# Patient Record
Sex: Female | Born: 1975 | Race: Black or African American | Hispanic: No | Smoking: Never smoker
Health system: Southern US, Community
[De-identification: ages and names within clinical notes are randomized; demographics above are authoritative.]

## PROBLEM LIST (undated history)

## (undated) ENCOUNTER — Emergency Department (HOSPITAL_BASED_OUTPATIENT_CLINIC_OR_DEPARTMENT_OTHER): Discharge status: Absent without leave | Payer: BC Managed Care – PPO

---

## 2009-11-09 ENCOUNTER — Ambulatory Visit: Payer: Self-pay | Admitting: Diagnostic Radiology

## 2009-11-09 ENCOUNTER — Emergency Department (HOSPITAL_BASED_OUTPATIENT_CLINIC_OR_DEPARTMENT_OTHER): Admission: EM | Admit: 2009-11-09 | Discharge: 2009-11-09 | Payer: Self-pay | Admitting: Emergency Medicine

## 2010-01-19 ENCOUNTER — Emergency Department (HOSPITAL_BASED_OUTPATIENT_CLINIC_OR_DEPARTMENT_OTHER): Admission: EM | Admit: 2010-01-19 | Discharge: 2010-01-19 | Payer: Self-pay | Admitting: Emergency Medicine

## 2011-01-07 LAB — BASIC METABOLIC PANEL
BUN: 11 mg/dL (ref 6–23)
Calcium: 9 mg/dL (ref 8.4–10.5)
GFR calc Af Amer: >60 mL/min (ref >60)
Glucose, Bld: 74 mg/dL (ref 70–99)
Potassium: 4.2 mEq/L (ref 3.5–5.1)
Sodium: 142 mEq/L (ref 135–145)

## 2011-09-18 ENCOUNTER — Emergency Department (HOSPITAL_BASED_OUTPATIENT_CLINIC_OR_DEPARTMENT_OTHER)
Admission: EM | Admit: 2011-09-18 | Discharge: 2011-09-18 | Disposition: A | Discharge status: Home / Self Care | Payer: Worker's Compensation | Attending: Emergency Medicine | Admitting: Emergency Medicine

## 2011-09-18 ENCOUNTER — Encounter: Payer: Self-pay | Admitting: Emergency Medicine

## 2011-09-18 ENCOUNTER — Emergency Department (INDEPENDENT_AMBULATORY_CARE_PROVIDER_SITE_OTHER): Payer: Worker's Compensation

## 2011-09-18 DIAGNOSIS — R0789 Other chest pain: Secondary | ICD-10-CM

## 2011-09-18 DIAGNOSIS — S29011A Strain of muscle and tendon of front wall of thorax, initial encounter: Secondary | ICD-10-CM

## 2011-09-18 DIAGNOSIS — R079 Chest pain, unspecified: Secondary | ICD-10-CM | POA: Insufficient documentation

## 2011-09-18 DIAGNOSIS — IMO0002 Reserved for concepts with insufficient information to code with codable children: Secondary | ICD-10-CM | POA: Insufficient documentation

## 2011-09-18 DIAGNOSIS — X58XXXA Exposure to other specified factors, initial encounter: Secondary | ICD-10-CM | POA: Insufficient documentation

## 2011-09-18 MED ORDER — HYDROCODONE-ACETAMINOPHEN 5-325 MG PO TABS: 2.0000 | ORAL_TABLET | ORAL | Status: AC | PRN

## 2011-09-18 MED ORDER — IBUPROFEN 800 MG PO TABS: 800.0000 mg | ORAL_TABLET | Freq: Three times a day (TID) | ORAL | Status: AC

## 2011-09-18 NOTE — ED Provider Notes (Signed)
Medical screening examination/treatment/procedure(s) were performed by non-physician practitioner and as supervising physician I was immediately available for consultation/collaboration.   Forbes Cellar, MD 09/18/11 1327

## 2011-09-18 NOTE — ED Notes (Signed)
Pt c/o LT chest pain, sharp, since yesterday after lifting a heavy box at work.

## 2011-09-18 NOTE — ED Provider Notes (Signed)
History     CSN: [id number] Arrival date & time: 09/18/2011 10:31 AM   First MD Initiated Contact with Patient 09/18/11 1202      Chief Complaint  Patient presents with  . Chest Pain    (Consider location/radiation/quality/duration/timing/severity/associated sxs/prior treatment) Patient is a 35 y.o. female presenting with chest pain. The history is provided by the patient. No language interpreter was used.  Chest Pain The chest pain began yesterday. Chest pain occurs constantly. The chest pain is unchanged. The pain is associated with breathing, lifting and exertion. At its most intense, the pain is at 9/10. The pain is currently at 9/10. The severity of the pain is moderate. The quality of the pain is described as aching, sharp and stabbing. The pain does not radiate. Chest pain is worsened by certain positions. Pertinent negatives for primary symptoms include no fever and no cough. She tried nothing for the symptoms. Risk factors include no known risk factors.  Her past medical history is significant for recent injury.  Pertinent negatives for past medical history include no PE.  Pertinent negatives for family medical history include: no hypertension in family.   Pt reports she began having pain yesterday after lifting heavy boxes.  History reviewed. No pertinent past medical history.  Past Surgical History  Procedure Date  . Cesarean section     No family history on file.  History  Substance Use Topics  . Smoking status: Never Smoker   . Smokeless tobacco: Not on file  . Alcohol Use: Yes     social    OB History    Grav Para Term Preterm Abortions TAB SAB Ect Mult Living                  Review of Systems  Constitutional: Negative for fever.  Respiratory: Negative for cough.   Cardiovascular: Positive for chest pain.  All other systems reviewed and are negative.    Allergies  Review of patient's allergies indicates no known allergies.  Home Medications    No current outpatient prescriptions on file.  BP 127/81  Pulse 58  Temp(Src) 98 F (36.7 C) (Oral)  Resp 24  SpO2 100%  Physical Exam  Nursing note and vitals reviewed. Constitutional: She is oriented to person, place, and time. She appears well-developed and well-nourished.  HENT:  Head: Normocephalic and atraumatic.  Right Ear: External ear normal.  Left Ear: External ear normal.  Nose: Nose normal.  Mouth/Throat: Oropharynx is clear and moist.  Eyes: Conjunctivae and EOM are normal. Pupils are equal, round, and reactive to light.  Neck: Normal range of motion. Neck supple.  Cardiovascular: Normal rate, regular rhythm and normal heart sounds.   Pulmonary/Chest: She exhibits tenderness.  Abdominal: Soft.  Musculoskeletal: Normal range of motion.  Neurological: She is alert and oriented to person, place, and time. She has normal reflexes.  Skin: Skin is warm.  Psychiatric: She has a normal mood and affect.    ED Course  Procedures (including critical care time)  Labs Reviewed - No data to display Dg Chest 2 View  09/18/2011  *RADIOLOGY REPORT*  Clinical Data: Left chest pain.  CHEST - 2 VIEW  Comparison: 11/09/2009.  Findings: Trachea is midline.  Heart size normal.  Lungs are clear. No pleural fluid.  IMPRESSION: No acute findings.  Original Report Authenticated By: Reyes Ivan, M.D.     No diagnosis found.    MDM          Langston Masker, PA  09/18/11 1320 

## 2011-12-17 ENCOUNTER — Encounter (HOSPITAL_BASED_OUTPATIENT_CLINIC_OR_DEPARTMENT_OTHER): Payer: Self-pay | Admitting: Emergency Medicine

## 2011-12-17 ENCOUNTER — Emergency Department (HOSPITAL_BASED_OUTPATIENT_CLINIC_OR_DEPARTMENT_OTHER)
Admission: EM | Admit: 2011-12-17 | Discharge: 2011-12-17 | Disposition: A | Discharge status: Home / Self Care | Payer: Worker's Compensation | Attending: Emergency Medicine | Admitting: Emergency Medicine

## 2011-12-17 ENCOUNTER — Emergency Department (INDEPENDENT_AMBULATORY_CARE_PROVIDER_SITE_OTHER): Payer: Worker's Compensation

## 2011-12-17 DIAGNOSIS — Y9289 Other specified places as the place of occurrence of the external cause: Secondary | ICD-10-CM | POA: Insufficient documentation

## 2011-12-17 DIAGNOSIS — M79609 Pain in unspecified limb: Secondary | ICD-10-CM

## 2011-12-17 DIAGNOSIS — W208XXA Other cause of strike by thrown, projected or falling object, initial encounter: Secondary | ICD-10-CM | POA: Insufficient documentation

## 2011-12-17 DIAGNOSIS — S60229A Contusion of unspecified hand, initial encounter: Secondary | ICD-10-CM

## 2011-12-17 NOTE — Discharge Instructions (Signed)
Contusion (Bruise) of Hand An injury to the hand may cause bruises (contusions). Contusions are caused by bleeding from small blood vessels (capillaries) that allow blood to leak out into the muscles, tendons, and surrounding soft tissue. This is followed by swelling and pain (inflammation). Contusions of the hand are common because of the use of hands in daily and recreational activities. Signs of a hand injury include pain, swelling, and a color change. Initially the skin may turn blue to purple in color. As the bruise ages, the color turns yellow and orange. Swelling may decrease the movement of the fingers. Contusions are seen more commonly with:  Contact sports (especially in football, wrestling, and basketball).   Use of medications that thin the blood (anticoagulants).   Use of aspirin and nonsteroidal anti-inflammatory agents that decrease the ability of the blood to clot.   Vitamin deficiencies.   Aging.  DIAGNOSIS  Diagnosis of hand injuries can be made by your own observation. If problems continue, a caregiver may be required for further evaluation and treatment. X-rays may be required to make sure there are no broken bones (fractures). Continued problems may require physical therapy for treatment. RISKS AND COMPLICATIONS  Extensive bleeding and tissue inflammation. This can lead to disability and arthritis-type problems later on if the hand does not heal properly.   Infection of the hand if there are breaks in the skin. This is especially true if the hand injury came from someone's teeth, such as would occur with punching someone in the mouth. This can lead to an infection of the tendons and the membranes surrounding the tendons (sheaths). This infection can have severe complications including a loss of function (a "frozen" hand).   Rupture of the tendons requiring a surgical repair. Failure to repair the tendons can result in loss of function of the hand or fingers.  HOME CARE  INSTRUCTIONS   Apply ice to the injury for 15 to 20 minutes, 3 to 4 times per day. Put the ice in a plastic bag and place a towel between the bag of ice and your skin.   An elastic bandage may be used initially for support and to minimize swelling. Do not wrap the hand too tightly. Do not sleep with the elastic bandage on.   Gentle massage from the fingertips towards the elbow will help keep the swelling down. Gently open and close your fist while doing this to maintain range of motion. Do this only after the first few days, when there is no or minimal pain.   Keep your hand above the level of the heart when swelling and pain are present. This will allow the fluid to drain out of the hand, decreasing the amount of swelling. This will improve healing time.   Try to avoid use of the injured hand (except for gentle range of motion) while the hand is hurting. Do not resume use until instructed by your caregiver. Then begin use gradually, do not increase use to the point of pain. If pain does develop, decrease use and continue the above measures, gradually increasing activities that do not cause discomfort until you achieve normal use.   Only take over-the-counter or prescription medicines for pain, discomfort, or fever as directed by your caregiver.   Follow up with your caregiver as directed. Follow-up care may include orthopedic referrals, physical therapy, and rehabilitation. Any delay in obtaining necessary care could result in delayed healing, or temporary or permanent disability.  REHABILITATION  Begin daily rehabilitation exercises when   an elastic bandage is no longer needed and you are either pain free or only have minimal pain.   Use ice massage for 10 minutes before and after workouts. Put ice in a plastic bag and place a towel between the bag of ice and your skin. Massage the injured area with the ice pack.  SEEK IMMEDIATE MEDICAL CARE IF:   Your pain and swelling increase, or pain is  uncontrolled with medications.   You have loss of feeling in your hand, or your hand turns cold or blue.   An oral temperature above 102 F (38.9 C) develops, not controlled by medication.   Your hand becomes warm to the touch, or you have increased pain with even slight movement of your fingers.   Your hand does not begin to improve in 1 or 2 days.   The skin is broken and signs of infection occur (fluid draining from the contusion, increasing pain, fever, headache, muscle aches, dizziness, or a general ill feeling).   You develop new, unexplained problems, or an increase of the symptoms that brought you to your caregiver.  MAKE SURE YOU:   Understand these instructions.   Will watch your condition.   Will get help right away if you are not doing well or get worse.  Document Released: 03/29/2002 Document Revised: 06/19/2011 Document Reviewed: 03/16/2010 [employer] Patient Information 2012 [employer]. 

## 2011-12-17 NOTE — ED Notes (Signed)
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

## 2011-12-17 NOTE — ED Provider Notes (Signed)
History     CSN: [id number]  Arrival date & time 12/17/11  1308   First MD Initiated Contact with Patient 12/17/11 [phone]      Chief Complaint  Patient presents with  . Hand Pain    (Consider location/radiation/quality/duration/timing/severity/associated sxs/prior treatment) HPI Comments: Box/basket dropped on hand yesterday at work.  Patient is a 36 y.o. female presenting with hand pain. The history is provided by the patient.  Hand Pain This is a new problem. The current episode started yesterday. The problem occurs constantly. The problem has not changed since onset.Exacerbated by: movement, palpation. The symptoms are relieved by nothing. She has tried nothing for the symptoms.    History reviewed. No pertinent past medical history.  Past Surgical History  Procedure Date  . Cesarean section     No family history on file.  History  Substance Use Topics  . Smoking status: Never Smoker   . Smokeless tobacco: Not on file  . Alcohol Use: No     social    OB History    Grav Para Term Preterm Abortions TAB SAB Ect Mult Living                  Review of Systems  All other systems reviewed and are negative.    Allergies  Review of patient's allergies indicates no known allergies.  Home Medications  No current outpatient prescriptions on file.  BP 144/94  Pulse 81  Temp(Src) 98.6 F (37 C) (Oral)  Resp 16  SpO2 100%  Physical Exam  Nursing note and vitals reviewed. Constitutional: She is oriented to person, place, and time. She appears well-developed and well-nourished.  HENT:  Head: Normocephalic and atraumatic.  Neck: Normal range of motion. Neck supple.  Pulmonary/Chest: Effort normal.  Musculoskeletal:       There is ttp over the dorsum of the hand with minimal swelling.  There is full range of motion with moderate discomfort.  Sensation intact distally.  Neurological: She is alert and oriented to person, place, and time.  Skin: Skin is warm and  dry.    ED Course  Procedures (including critical care time)  Labs Reviewed - No data to display No results found.   No diagnosis found.    MDM          Geoffery Lyons, MD 12/17/11 [phone]

## 2013-02-20 ENCOUNTER — Encounter (HOSPITAL_BASED_OUTPATIENT_CLINIC_OR_DEPARTMENT_OTHER): Payer: Self-pay | Admitting: *Deleted

## 2013-02-20 ENCOUNTER — Emergency Department (HOSPITAL_BASED_OUTPATIENT_CLINIC_OR_DEPARTMENT_OTHER)
Admission: EM | Admit: 2013-02-20 | Discharge: 2013-02-20 | Disposition: A | Discharge status: Home / Self Care | Payer: BC Managed Care – PPO | Attending: Emergency Medicine | Admitting: Emergency Medicine

## 2013-02-20 DIAGNOSIS — H579 Unspecified disorder of eye and adnexa: Secondary | ICD-10-CM | POA: Insufficient documentation

## 2013-02-20 DIAGNOSIS — K2970 Gastritis, unspecified, without bleeding: Secondary | ICD-10-CM | POA: Insufficient documentation

## 2013-02-20 DIAGNOSIS — R109 Unspecified abdominal pain: Secondary | ICD-10-CM | POA: Insufficient documentation

## 2013-02-20 DIAGNOSIS — Z3202 Encounter for pregnancy test, result negative: Secondary | ICD-10-CM | POA: Insufficient documentation

## 2013-02-20 DIAGNOSIS — H109 Unspecified conjunctivitis: Secondary | ICD-10-CM | POA: Insufficient documentation

## 2013-02-20 DIAGNOSIS — R110 Nausea: Secondary | ICD-10-CM | POA: Insufficient documentation

## 2013-02-20 LAB — COMPREHENSIVE METABOLIC PANEL
Alkaline Phosphatase: 60 U/L (ref 39–117)
CO2: 27 mEq/L (ref 19–32)
Chloride: 104 mEq/L (ref 96–112)
GFR calc Af Amer: >90 mL/min (ref >90)
Glucose, Bld: 97 mg/dL (ref 70–99)
Sodium: 139 mEq/L (ref 135–145)
Total Bilirubin: 0.4 mg/dL (ref 0.3–1.2)

## 2013-02-20 LAB — CBC WITH DIFFERENTIAL/PLATELET
Basophils Absolute: 0 10*3/uL (ref 0.0–0.1)
Basophils Relative: 0 % (ref 0–1)
Lymphocytes Relative: 36 % (ref 12–46)
MCH: 30.4 pg (ref 26.0–34.0)
MCHC: 34.3 g/dL (ref 30.0–36.0)
MCV: 88.6 fL (ref 78.0–100.0)
Monocytes Absolute: 0.4 10*3/uL (ref 0.1–1.0)
Monocytes Relative: 5 % (ref 3–12)
Neutro Abs: 4.7 10*3/uL (ref 1.7–7.7)
Neutrophils Relative %: 56 % (ref 43–77)
RBC: 4.31 MIL/uL (ref 3.87–5.11)
WBC: 8.4 10*3/uL (ref 4.0–10.5)

## 2013-02-20 LAB — URINALYSIS, ROUTINE W REFLEX MICROSCOPIC
Ketones, ur: NEGATIVE mg/dL
Leukocytes, UA: NEGATIVE
Specific Gravity, Urine: 1.012 (ref 1.005–1.030)
pH: 7 (ref 5.0–8.0)

## 2013-02-20 LAB — URINE MICROSCOPIC-ADD ON

## 2013-02-20 LAB — LIPASE, BLOOD: Lipase: 25 U/L (ref 11–59)

## 2013-02-20 LAB — PREGNANCY, URINE: Preg Test, Ur: NEGATIVE

## 2013-02-20 MED ORDER — PANTOPRAZOLE SODIUM 40 MG PO TBEC: 40.0000 mg | DELAYED_RELEASE_TABLET | Freq: Every day | ORAL | Status: DC

## 2013-02-20 MED ORDER — SULFACETAMIDE SODIUM 10 % OP SOLN
1.0000 [drp] | OPHTHALMIC | Status: DC
  Administered 2013-02-20: 1 [drp] via OPHTHALMIC
  Filled 2013-02-20: qty 15

## 2013-02-20 NOTE — ED Notes (Signed)
Gave instruction for eye drops:  1 gtt in each eye every 2 hours while awake for 5 days

## 2013-02-20 NOTE — ED Notes (Addendum)
Pt states that eyes have been red and itching since yesterday, Green drainage, Congested. "[place] going around school" Also c/o abd pain and nausea. Denies vag d/c or urinary s/s.

## 2013-02-20 NOTE — ED Provider Notes (Signed)
History     CSN: [id number]  Arrival date & time 02/20/13  1106   First MD Initiated Contact with Patient 02/20/13 1126      Chief Complaint  Patient presents with  . Eye Problem  . Abdominal Pain    (Consider location/radiation/quality/duration/timing/severity/associated sxs/prior treatment) Patient is a 37 y.o. female presenting with eye problem and abdominal pain.  Eye Problem Abdominal Pain  Pt reports 2 days of worsening redness, itching a drainage to both eyes. Woke up with crusting this AM. 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 Denies any fever or blurry vision, does not wear contact, mild nasal congestion.   She has a secondary complaint of intermittent epigastric pain for the last month, described as gnawing, improved after eating, occasionally associated with nausea. No lower abdominal pain no dysuria, hematuria, vaginal bleeding or discharge. She has not had pain today.   History reviewed. No pertinent past medical history.  Past Surgical History  Procedure Laterality Date  . Cesarean section      History reviewed. No pertinent family history.  History  Substance Use Topics  . Smoking status: Never Smoker   . Smokeless tobacco: Not on file  . Alcohol Use: No     Comment: social    OB History   Grav Para Term Preterm Abortions TAB SAB Ect Mult Living                  Review of Systems  Gastrointestinal: Positive for abdominal pain.   All other systems reviewed and are negative except as noted in HPI.   Allergies  Review of patient's allergies indicates no known allergies.  Home Medications  No current outpatient prescriptions on file.  BP 127/70  Pulse 58  Temp(Src) 98.1 F (36.7 C) (Oral)  Resp 18  Ht 5\' 2"  (1.575 m)  Wt 227 lb (102.967 kg)  BMI 41.51 kg/m2  SpO2 100%  LMP 01/19/2013  Physical Exam  Nursing note and vitals reviewed. Constitutional: She is oriented to person, place, and time. She  appears well-developed and well-nourished.  HENT:  Head: Normocephalic and atraumatic.  Eyes: EOM are normal. Pupils are equal, round, and reactive to light. Right eye exhibits discharge. Left eye exhibits discharge. Right conjunctiva is injected. Left conjunctiva is injected.  Neck: Normal range of motion. Neck supple.  Cardiovascular: Normal rate, normal heart sounds and intact distal pulses.   Pulmonary/Chest: Effort normal and breath sounds normal.  Abdominal: Bowel sounds are normal. She exhibits no distension. There is no tenderness.  Musculoskeletal: Normal range of motion. She exhibits no edema and no tenderness.  Neurological: She is alert and oriented to person, place, and time. She has normal strength. No cranial nerve deficit or sensory deficit.  Skin: Skin is warm and dry. No rash noted.  Psychiatric: She has a normal mood and affect.    ED Course  Procedures (including critical care time)  Labs Reviewed  URINALYSIS, ROUTINE W REFLEX MICROSCOPIC - Abnormal; Notable for the following:    Hgb urine dipstick LARGE (*)    All other components within normal limits  URINE MICROSCOPIC-ADD ON - Abnormal; Notable for the following:    Squamous Epithelial / LPF FEW (*)    Bacteria, UA FEW (*)    All other components within normal limits  PREGNANCY, URINE  CBC WITH DIFFERENTIAL  COMPREHENSIVE METABOLIC PANEL  LIPASE, BLOOD   No results found.   1. Conjunctivitis   2. Gastritis  MDM  Labs unremarkable. Abdomen benign. Will treat with PPI for possible gastritis. Abx eye drops for conjunctivitis, PCP followup.        Charles B. Bernette Mayers, MD 02/20/13 [phone]

## 2014-02-08 ENCOUNTER — Encounter (HOSPITAL_BASED_OUTPATIENT_CLINIC_OR_DEPARTMENT_OTHER): Payer: Self-pay | Admitting: Emergency Medicine

## 2014-02-08 ENCOUNTER — Emergency Department (HOSPITAL_BASED_OUTPATIENT_CLINIC_OR_DEPARTMENT_OTHER)
Admission: EM | Admit: 2014-02-08 | Discharge: 2014-02-08 | Disposition: A | Discharge status: Home / Self Care | Payer: BC Managed Care – PPO | Attending: Emergency Medicine | Admitting: Emergency Medicine

## 2014-02-08 DIAGNOSIS — Z79899 Other long term (current) drug therapy: Secondary | ICD-10-CM | POA: Insufficient documentation

## 2014-02-08 DIAGNOSIS — I8390 Asymptomatic varicose veins of unspecified lower extremity: Secondary | ICD-10-CM | POA: Insufficient documentation

## 2014-02-08 NOTE — ED Notes (Signed)
MD at bedside. 

## 2014-02-08 NOTE — ED Provider Notes (Signed)
CSN: [id number]     Arrival date & time 02/08/14  2005 History  This chart was scribed for Hilario Quarryanielle S Tarryn Bogdan, MD by Ardelia Memsylan Malpass, ED Scribe. This patient was seen in room MH12/MH12 and the patient's care was started at 9:08 PM    Chief Complaint  Patient presents with  . Leg Pain     Patient is a 38 y.o. female presenting with leg pain. The history is provided by the patient. No language interpreter was used.  Leg Pain Lower extremity pain location: left calf. Time since incident:  1 day Injury: no   Pain details:    Quality:  Shooting   Radiates to:  L leg   Timing:  Constant   Progression:  Worsening Chronicity:  New Foreign body present:  No foreign bodies Prior injury to area:  No Relieved by:  None tried Worsened by:  Nothing tried Ineffective treatments:  None tried   HPI Comments: Elizabeth Stampsrina Rios is a 38 y.o. female who presents to the Emergency Department complaining of constant "shooting" moderate left calf pain that radiates throughout her entire leg onset today. Patient states that she has had a varicose vein to that area for years but notes it has swollen up today and caused the pain. Patient denies any injury to the area. Patient denies any chronic health problems or history of DVT.   History reviewed. No pertinent past medical history. Past Surgical History  Procedure Laterality Date  . Cesarean section     No family history on file. History  Substance Use Topics  . Smoking status: Never Smoker   . Smokeless tobacco: Not on file  . Alcohol Use: Yes     Comment: social   OB History   Grav Para Term Preterm Abortions TAB SAB Ect Mult Living                 Review of Systems  Musculoskeletal:       Left calf pain  All other systems reviewed and are negative.   Allergies  Review of patient's allergies indicates no known allergies.  Home Medications   Prior to Admission medications   Medication Sig Start Date End Date Taking? Authorizing Provider   pantoprazole (PROTONIX) 40 MG tablet Take 1 tablet (40 mg total) by mouth daily. 02/20/13   Charles B. Bernette MayersSheldon, MD   Molli Knockraige Vitals: BP 152/108  Pulse 90  Temp(Src) 98.4 F (36.9 C) (Oral)  Resp 22  Wt 245 lb (111.131 kg)  SpO2 100%  LMP 01/08/2014  Physical Exam  Nursing note and vitals reviewed. Constitutional: She is oriented to person, place, and time. She appears well-developed and well-nourished. No distress.  HENT:  Head: Normocephalic and atraumatic.  Eyes: EOM are normal.  Neck: Neck supple. No tracheal deviation present.  Cardiovascular: Normal rate.   Pulmonary/Chest: Effort normal. No respiratory distress.  Musculoskeletal: Normal range of motion.  varicosity to the left upper leg, posterior aspect Tender to palpation, quarter in size  Neurological: She is alert and oriented to person, place, and time.  Skin: Skin is warm and dry.  Psychiatric: She has a normal mood and affect. Her behavior is normal.    ED Course  Procedures (including critical care time)  DIAGNOSTIC STUDIES: Oxygen Saturation is 100% on RA, normal by my interpretation.    COORDINATION OF CARE: 9:11 PM- Advised pt to apply warm compresses. Pt advised of plan for discharge and pt agrees with plan.  Labs Review Labs Reviewed - No data  to display  Imaging Review No results found.   EKG Interpretation None      MDM   Final diagnoses:  Varicose vein of leg    I personally performed the services described in this documentation, which was scribed in my presence. The recorded information has been reviewed and considered.   Hilario Quarryanielle S Valeen Borys, MD 02/09/14 [phone]

## 2014-02-08 NOTE — ED Notes (Signed)
"  vericose vein" on the back of left calf that is causing pain today.

## 2014-02-08 NOTE — Discharge Instructions (Signed)
Phlebitis Phlebitis is soreness and swelling (inflammation) of a vein. This can occur in your arms, legs, or torso (trunk), as well as deeper inside your body. Phlebitis is usually not serious when it occurs close to the surface of the body. However, it can cause serious problems when it occurs in a vein deeper inside the body. CAUSES  Phlebitis can be triggered by various things, including:   Reduced blood flow through your veins. This can happen with:  Bed rest over a long period.  Long-distance travel.  Injury.  Surgery.  Being overweight (obese) or pregnant.  Having an IV tube put in the vein and getting certain medicines through the vein.  Cancer and cancer treatment.  Use of illegal drugs taken through the vein.  Inflammatory diseases.  Inherited (genetic) diseases that increase the risk of blood clots.  Hormone therapy, such as birth control pills. SIGNS AND SYMPTOMS   Red, tender, swollen, and painful area on your skin. Usually, the area will be long and narrow.  Firmness along the center of the affected area. This can indicate that a blood clot has formed.  Low-grade fever. DIAGNOSIS  A health care provider can usually diagnose phlebitis by examining the affected area and asking about your symptoms. To check for infection or blood clots, your health care provider may order blood tests or an ultrasound exam of the area. Blood tests and your family history may also indicate if you have an underlying genetic disease that causes blood clots. Occasionally, a piece of tissue is taken from the body (biopsy sample) if an unusual cause of phlebitis is suspected. TREATMENT  Treatment will vary depending on the severity of the condition and the area of the body affected. Treatment may include:  Use of a warm compress or heating pad.  Use of compression stockings or bandages.  Anti-inflammatory medicines.  Removal of any IV tube that may be causing the problem.  Medicines  that kill germs (antibiotics) if an infection is present.  Blood-thinning medicines if a blood clot is suspected or present.  In rare cases, surgery may be needed to remove damaged sections of vein. HOME CARE INSTRUCTIONS   Only take over-the-counter or prescription medicines as directed by your health care provider. Take all medicines exactly as prescribed.  Raise (elevate) the affected area above the level of your heart as directed by your health care provider.  Apply a warm compress or heating pad to the affected area as directed by your health care provider. Do not sleep with the heating pad.  Use compression stockings or bandages as directed. These will speed healing and prevent the condition from coming back.  If you are on blood thinners:  Get follow-up blood tests as directed by your health care provider.  Check with your health care provider before using any new medicines.  Carry a medical alert card or wear your medical alert jewelry to show that you are on blood thinners.  For phlebitis in the legs:  Avoid prolonged standing or bed rest.  Keep your legs moving. Raise your legs when sitting or lying.  Do not smoke.  Women, particularly those over the age of 59, should consider the risks and benefits of taking the contraceptive pill. This kind of hormone treatment can increase your risk for blood clots.  Follow up with your health care provider as directed. SEEK MEDICAL CARE IF:   You have unusual bruising or any bleeding problems.  Your swelling or pain in the affected area  is not improving.  You are on anti-inflammatory medicine, and you develop belly (abdominal) pain. SEEK IMMEDIATE MEDICAL CARE IF:   You have a sudden onset of chest pain or difficulty breathing.  You have a fever or persistent symptoms for more than 2 3 days.  You have a fever and your symptoms suddenly get worse. MAKE SURE YOU:  Understand these instructions.  Will watch your  condition.  Will get help right away if you are not doing well or get worse. Document Released: 10/01/2001 Document Revised: 07/28/2013 Document Reviewed: 06/14/2013 [facility] Patient Information 2014 [employer], [place]. Varicose Veins Varicose veins are veins that have become enlarged and twisted. CAUSES This condition is the result of valves in the veins not working properly. Valves in the veins help return blood from the leg to the heart. If these valves are damaged, blood flows backwards and backs up into the veins in the leg near the skin. This causes the veins to become larger. People who are on their feet a lot, who are pregnant, or who are overweight are more likely to develop varicose veins. SYMPTOMS   Bulging, twisted-appearing, bluish veins, most commonly found on the legs.  Leg pain or a feeling of heaviness. These symptoms may be worse at the end of the day.  Leg swelling.  Skin color changes. DIAGNOSIS  Varicose veins can usually be diagnosed with an exam of your legs by your caregiver. He or she may recommend an ultrasound of your leg veins. TREATMENT  Most varicose veins can be treated at home.However, other treatments are available for people who have persistent symptoms or who want to treat the cosmetic appearance of the varicose veins. These include:  Laser treatment of very small varicose veins.  Medicine that is shot (injected) into the vein. This medicine hardens the walls of the vein and closes off the vein. This treatment is called sclerotherapy. Afterwards, you may need to wear clothing or bandages that apply pressure.  Surgery. HOME CARE INSTRUCTIONS   Do not stand or sit in one position for long periods of time. Do not sit with your legs crossed. Rest with your legs raised during the day.  Wear elastic stockings or support hose. Do not wear other tight, encircling garments around the legs, pelvis, or waist.  Walk as much as possible to increase blood  flow.  Raise the foot of your bed at night with 2-inch blocks.  If you get a cut in the skin over the vein and the vein bleeds, lie down with your leg raised and press on it with a clean cloth until the bleeding stops. Then place a bandage (dressing) on the cut. See your caregiver if it continues to bleed or needs stitches. SEEK MEDICAL CARE IF:   The skin around your ankle starts to break down.  You have pain, redness, tenderness, or hard swelling developing in your leg over a vein.  You are uncomfortable due to leg pain. Document Released: 07/17/2005 Document Revised: 12/30/2011 Document Reviewed: 12/03/2010 [facility] Patient Information 2014 [employer], [place].

## 2014-08-11 ENCOUNTER — Emergency Department (HOSPITAL_BASED_OUTPATIENT_CLINIC_OR_DEPARTMENT_OTHER)
Admission: EM | Admit: 2014-08-11 | Discharge: 2014-08-11 | Disposition: A | Discharge status: Home / Self Care | Payer: BC Managed Care – PPO | Attending: Emergency Medicine | Admitting: Emergency Medicine

## 2014-08-11 ENCOUNTER — Encounter (HOSPITAL_BASED_OUTPATIENT_CLINIC_OR_DEPARTMENT_OTHER): Payer: Self-pay | Admitting: Emergency Medicine

## 2014-08-11 ENCOUNTER — Emergency Department (HOSPITAL_BASED_OUTPATIENT_CLINIC_OR_DEPARTMENT_OTHER): Payer: BC Managed Care – PPO

## 2014-08-11 DIAGNOSIS — R2242 Localized swelling, mass and lump, left lower limb: Secondary | ICD-10-CM | POA: Diagnosis not present

## 2014-08-11 DIAGNOSIS — M79672 Pain in left foot: Secondary | ICD-10-CM | POA: Insufficient documentation

## 2014-08-11 DIAGNOSIS — Z87828 Personal history of other (healed) physical injury and trauma: Secondary | ICD-10-CM | POA: Diagnosis not present

## 2014-08-11 DIAGNOSIS — Z79899 Other long term (current) drug therapy: Secondary | ICD-10-CM | POA: Insufficient documentation

## 2014-08-11 MED ORDER — IBUPROFEN 800 MG PO TABS
800.0000 mg | ORAL_TABLET | Freq: Once | ORAL | Status: AC
  Administered 2014-08-11: 800 mg via ORAL
  Filled 2014-08-11: qty 1

## 2014-08-11 MED ORDER — HYDROCODONE-ACETAMINOPHEN 5-325 MG PO TABS
2.0000 | ORAL_TABLET | Freq: Once | ORAL | Status: AC
  Administered 2014-08-11: 2 via ORAL
  Filled 2014-08-11: qty 2

## 2014-08-11 MED ORDER — IBUPROFEN 800 MG PO TABS: 800.0000 mg | ORAL_TABLET | Freq: Three times a day (TID) | ORAL | Status: DC

## 2014-08-11 MED ORDER — HYDROCODONE-ACETAMINOPHEN 5-325 MG PO TABS: 1.0000 | ORAL_TABLET | Freq: Four times a day (QID) | ORAL | Status: DC | PRN
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
## 2014-08-11 NOTE — ED Notes (Signed)
Pt reports pain/swelling to left foot x 2 days. Denies injury

## 2014-08-11 NOTE — Discharge Instructions (Signed)
Take motrin for pain.   Take vicodin for severe pain. Do NOT drive with it.   Follow up with Dr. Pearletha ForgeHudnall if you still have pain.   Use crutches as needed.   Return to ER if you have severe pain, unable to walk.

## 2016-03-20 ENCOUNTER — Emergency Department (HOSPITAL_BASED_OUTPATIENT_CLINIC_OR_DEPARTMENT_OTHER)
Admission: EM | Admit: 2016-03-20 | Discharge: 2016-03-20 | Disposition: A | Discharge status: Home / Self Care | Payer: Worker's Compensation | Attending: Emergency Medicine | Admitting: Emergency Medicine

## 2016-03-20 ENCOUNTER — Encounter (HOSPITAL_BASED_OUTPATIENT_CLINIC_OR_DEPARTMENT_OTHER): Payer: Self-pay | Admitting: *Deleted

## 2016-03-20 ENCOUNTER — Emergency Department (HOSPITAL_BASED_OUTPATIENT_CLINIC_OR_DEPARTMENT_OTHER): Payer: Worker's Compensation

## 2016-03-20 DIAGNOSIS — S8991XA Unspecified injury of right lower leg, initial encounter: Secondary | ICD-10-CM | POA: Diagnosis present

## 2016-03-20 DIAGNOSIS — S8011XA Contusion of right lower leg, initial encounter: Secondary | ICD-10-CM | POA: Insufficient documentation

## 2016-03-20 DIAGNOSIS — Y999 Unspecified external cause status: Secondary | ICD-10-CM | POA: Insufficient documentation

## 2016-03-20 DIAGNOSIS — W11XXXA Fall on and from ladder, initial encounter: Secondary | ICD-10-CM | POA: Diagnosis not present

## 2016-03-20 DIAGNOSIS — Y9339 Activity, other involving climbing, rappelling and jumping off: Secondary | ICD-10-CM | POA: Diagnosis not present

## 2016-03-20 DIAGNOSIS — Y929 Unspecified place or not applicable: Secondary | ICD-10-CM | POA: Insufficient documentation

## 2016-03-20 DIAGNOSIS — W010XXA Fall on same level from slipping, tripping and stumbling without subsequent striking against object, initial encounter: Secondary | ICD-10-CM

## 2016-03-20 MED ORDER — ACETAMINOPHEN 500 MG PO TABS
1000.0000 mg | ORAL_TABLET | Freq: Once | ORAL | Status: AC
  Administered 2016-03-20: 1000 mg via ORAL
  Filled 2016-03-20: qty 2

## 2016-03-20 NOTE — ED Provider Notes (Signed)
CSN: [id number]     Arrival date & time 03/20/16  1739 History   By signing my name below, I, [name], attest that this documentation has been prepared under the direction and in the presence of Cathren LaineKevin Ziv Welchel, MD.  Electronically Signed: Gillis EndsJasmyn B. Lyn HollingsheadAlexander, ED Scribe. 03/20/2016. 6:18 PM.    Chief Complaint  Patient presents with  . Leg Injury    The history is provided by the patient. No language interpreter was used.    HPI Comments: Elizabeth Rios is a 40 y.o. female who presents to the Emergency Department complaining of gradual onset, constant, throbbing, lower right leg pain that radiates to the right foot which occurred about 10 hours PTA. She reports that she climbed up 4 steps on the ladder while at work. While coming down from the ladder, she noticed a snake on the shelf which scared the patient causing her to miss two steps off the ladder and land on her right leg. No head injury or LOC. Pt reports that she has generalized body 'soreness' from the fall. She has not taken any medicines for pain. There are no modifying factors. She denies any chest pain, abdominal pain, shortness of breath, weakness, numbness, tingling, hip pain, knee pain, or back pain.      History reviewed. No pertinent past medical history. Past Surgical History  Procedure Laterality Date  . Cesarean section     History reviewed. No pertinent family history. Social History  Substance Use Topics  . Smoking status: Never Smoker   . Smokeless tobacco: None  . Alcohol Use: Yes     Comment: social   OB History    No data available     Review of Systems  Respiratory: Negative for shortness of breath.   Cardiovascular: Negative for chest pain.  Gastrointestinal: Negative for abdominal pain.  Musculoskeletal: Positive for myalgias and arthralgias. Negative for back pain and neck pain.  Skin: Negative for wound.  Neurological: Negative for syncope, weakness and numbness.  All other systems  reviewed and are negative.   Allergies  Review of patient's allergies indicates no known allergies.  Home Medications   Prior to Admission medications   Not on File   BP 164/93 mmHg  Pulse 80  Temp(Src) 99.1 F (37.3 C) (Oral)  Resp 18  Ht 5\' 1"  (1.549 m)  Wt 220 lb (99.791 kg)  BMI 41.59 kg/m2  SpO2 98%   Physical Exam  Constitutional: She appears well-developed and well-nourished.  HENT:  Head: Normocephalic and atraumatic.  Eyes: EOM are normal. Pupils are equal, round, and reactive to light.  Neck: Normal range of motion.  Cardiovascular: Normal rate, regular rhythm, normal heart sounds and intact distal pulses.   Pulmonary/Chest: Effort normal and breath sounds normal. No respiratory distress. She has no wheezes. She has no rales.  Abdominal: Soft. Bowel sounds are normal. She exhibits no distension. There is no tenderness. There is no rebound and no guarding.  Musculoskeletal: Normal range of motion. She exhibits tenderness. She exhibits no edema.  No significant soft tissue swelling.  compartments of lower leg soft, not tense. Knee stable. No effusion. good rom without pain. Tenderness to palpation of right lower leg. Distal pulses palp. Ankle stable, non tender.  Neurological: She is alert. No cranial nerve deficit.  RLE motor, sens intact. Steady gait.   Skin: Skin is warm and dry. No rash noted.  Psychiatric: She has a normal mood and affect. Judgment normal.  Nursing note and vitals reviewed.  ED Course  Procedures (including critical care time) DIAGNOSTIC STUDIES: Oxygen Saturation is 98% on RA, normal by my interpretation.    COORDINATION OF CARE: 6:18 PM-Discussed treatment plan which includes X-Ray of lower right leg with pt at bedside and pt agreed to plan. Will order Tylenol for pain.   Dg Tibia/fibula Right  03/20/2016  CLINICAL DATA:  Pain after fall EXAM: RIGHT TIBIA AND FIBULA - 2 VIEW COMPARISON:  None. FINDINGS: There is no evidence of fracture  or other focal bone lesions. Soft tissues are unremarkable. IMPRESSION: Negative. Electronically Signed   By: Gerome Sam III M.D   On: 03/20/2016 19:27      I have personally reviewed and evaluated these images as part of my medical decision-making.    MDM   I personally performed the services described in this documentation, which was scribed in my presence. The recorded information has been reviewed and considered. Cathren Laine, MD  Xrays.  xrays neg.  Motrin po.  Icepack.  Discussed xrays w pt.    Pt requests work note/light duty.      Cathren Laine, MD 03/20/16 [phone]

## 2016-03-20 NOTE — Discharge Instructions (Signed)
It was our pleasure to provide your ER care today - we hope that you feel better.  Cold pack to sore area.  Take motrin or aleve as need for pain.  Follow up with primary care doctor in 1 week if symptoms fail to improve/resolve.  Return to ER if worse, new symptoms, severe pain, other concern.     Contusion A contusion is a deep bruise. Contusions happen when an injury causes bleeding under the skin. Symptoms of bruising include pain, swelling, and discolored skin. The skin may turn blue, purple, or yellow. HOME CARE   Rest the injured area.  If told, put ice on the injured area.  Put ice in a plastic bag.  Place a towel between your skin and the bag.  Leave the ice on for 20 minutes, 2-3 times per day.  If told, put light pressure (compression) on the injured area using an elastic bandage. Make sure the bandage is not too tight. Remove it and put it back on as told by your doctor.  If possible, raise (elevate) the injured area above the level of your heart while you are sitting or lying down.  Take over-the-counter and prescription medicines only as told by your doctor. GET HELP IF:  Your symptoms do not get better after several days of treatment.  Your symptoms get worse.  You have trouble moving the injured area. GET HELP RIGHT AWAY IF:   You have very bad pain.  You have a loss of feeling (numbness) in a hand or foot.  Your hand or foot turns pale or cold.   This information is not intended to replace advice given to you by your health care provider. Make sure you discuss any questions you have with your health care provider.   Document Released: 03/25/2008 Document Revised: 06/28/2015 Document Reviewed: 02/22/2015 Elsevier Interactive Patient Education 2016 Elsevier Inc.     Cryotherapy Cryotherapy is when you put ice on your injury. Ice helps lessen pain and puffiness (swelling) after an injury. Ice works the best when you start using it in the first 24  to 48 hours after an injury. HOME CARE  Put a dry or damp towel between the ice pack and your skin.  You may press gently on the ice pack.  Leave the ice on for no more than 10 to 20 minutes at a time.  Check your skin after 5 minutes to make sure your skin is okay.  Rest at least 20 minutes between ice pack uses.  Stop using ice when your skin loses feeling (numbness).  Do not use ice on someone who cannot tell you when it hurts. This includes small children and people with memory problems (dementia). GET HELP RIGHT AWAY IF:  You have white spots on your skin.  Your skin turns blue or pale.  Your skin feels waxy or hard.  Your puffiness gets worse. MAKE SURE YOU:   Understand these instructions.  Will watch your condition.  Will get help right away if you are not doing well or get worse.   This information is not intended to replace advice given to you by your health care provider. Make sure you discuss any questions you have with your health care provider.   Document Released: 03/25/2008 Document Revised: 12/30/2011 Document Reviewed: 05/30/2011 Elsevier Interactive Patient Education [employer] Elsevier Inc.

## 2016-03-20 NOTE — ED Notes (Signed)
Patient transported to X-ray 

## 2016-03-20 NOTE — ED Notes (Signed)
Pt c/o fall this am with right leg foot and ankle pain

## 2016-03-20 NOTE — ED Notes (Signed)
MD at bedside. 

## 2016-05-06 DIAGNOSIS — R600 Localized edema: Secondary | ICD-10-CM | POA: Insufficient documentation

## 2016-05-06 DIAGNOSIS — I8390 Asymptomatic varicose veins of unspecified lower extremity: Secondary | ICD-10-CM | POA: Insufficient documentation

## 2016-11-05 ENCOUNTER — Emergency Department (HOSPITAL_BASED_OUTPATIENT_CLINIC_OR_DEPARTMENT_OTHER): Payer: BC Managed Care – PPO

## 2016-11-05 ENCOUNTER — Encounter (HOSPITAL_BASED_OUTPATIENT_CLINIC_OR_DEPARTMENT_OTHER): Payer: Self-pay | Admitting: *Deleted

## 2016-11-05 ENCOUNTER — Emergency Department (HOSPITAL_BASED_OUTPATIENT_CLINIC_OR_DEPARTMENT_OTHER)
Admission: EM | Admit: 2016-11-05 | Discharge: 2016-11-06 | Disposition: A | Discharge status: Home / Self Care | Payer: BC Managed Care – PPO | Attending: Emergency Medicine | Admitting: Emergency Medicine

## 2016-11-05 DIAGNOSIS — R0789 Other chest pain: Secondary | ICD-10-CM | POA: Insufficient documentation

## 2016-11-05 DIAGNOSIS — R072 Precordial pain: Secondary | ICD-10-CM | POA: Diagnosis present

## 2016-11-05 LAB — BASIC METABOLIC PANEL
ANION GAP: 7 (ref 5–15)
BUN: 11 mg/dL (ref 6–20)
CO2: 26 mmol/L (ref 22–32)
Calcium: 8.9 mg/dL (ref 8.9–10.3)
Chloride: 102 mmol/L (ref 101–111)
Creatinine, Ser: 0.7 mg/dL (ref 0.44–1.00)
GFR calc Af Amer: >60 mL/min (ref >60)
GFR calc non Af Amer: >60 mL/min (ref >60)
GLUCOSE: 91 mg/dL (ref 65–99)
POTASSIUM: 3.7 mmol/L (ref 3.5–5.1)
Sodium: 135 mmol/L (ref 135–145)

## 2016-11-05 LAB — CBC
HCT: 37.5 % (ref 36.0–46.0)
HEMOGLOBIN: 12.8 g/dL (ref 12.0–15.0)
MCH: 30 pg (ref 26.0–34.0)
MCHC: 34.1 g/dL (ref 30.0–36.0)
MCV: 87.8 fL (ref 78.0–100.0)
Platelets: 267 10*3/uL (ref 150–400)
RBC: 4.27 MIL/uL (ref 3.87–5.11)
RDW: 11.9 % (ref 11.5–15.5)
WBC: 11.4 10*3/uL — ABNORMAL HIGH (ref 4.0–10.5)

## 2016-11-05 LAB — TROPONIN I: Troponin I: <0.03 ng/mL (ref <0.03)

## 2016-11-05 NOTE — ED Triage Notes (Signed)
Sharp pain in her left upper chest x 45 minutes.

## 2016-11-06 DIAGNOSIS — R0789 Other chest pain: Secondary | ICD-10-CM | POA: Diagnosis not present

## 2016-11-06 LAB — TROPONIN I: Troponin I: <0.03 ng/mL (ref <0.03)

## 2016-11-06 NOTE — ED Notes (Signed)
ED Provider at bedside. 

## 2016-11-06 NOTE — ED Provider Notes (Signed)
[facility] Provider Note: Lowella DellJ. Lane Elsey Holts, MD, FACEP  CSN: [id number] MRN: [id number] ARRIVAL: 11/05/16 at 2221 ROOM: [facility]   CHIEF COMPLAINT  Chest Pain   HISTORY OF PRESENT ILLNESS  Elizabeth Rios is a 41 y.o. female without significant past medical history. She had the sudden onset of chest pain about 9 PM yesterday evening. The chest pain was well localized in the left upper chest to the left of the sternum. She describes the pain as moderate and having both sharp and dull components. The pain was worse with deep breathing. The pain lasted about 1-2 seconds. She has had 2 or 3 recurrences since but none in several hours. There was no associated shortness of breath, diaphoresis or nausea. There is no pain with movement or pressure on the chest. She denies lower extremity pain or swelling. She denies recent travel.   History reviewed. No pertinent past medical history.  Past Surgical History:  Procedure Laterality Date  . CESAREAN SECTION      No family history on file.  Social History  Substance Use Topics  . Smoking status: Never Smoker  . Smokeless tobacco: Never Used  . Alcohol use Yes     Comment: social    Prior to Admission medications   Not on File    Allergies Patient has no known allergies.   REVIEW OF SYSTEMS  Negative except as noted here or in the History of Present Illness.   PHYSICAL EXAMINATION  Initial Vital Signs Blood pressure 106/74, pulse 97, temperature 97.8 F (36.6 C), temperature source Oral, resp. rate 22, height 5\' 2"  (1.575 m), weight 265 lb (120.2 kg), SpO2 100 %.  Examination General: Well-developed, well-nourished female in no acute distress; appearance consistent with age of record HENT: normocephalic; atraumatic Eyes: pupils equal, round and reactive to light; extraocular muscles intact Neck: supple Heart: regular rate and rhythm Lungs: clear to auscultation bilaterally Chest: Nontender Abdomen: soft;  nondistended; nontender; no masses or hepatosplenomegaly; bowel sounds present Extremities: No deformity; full range of motion; pulses normal; no edema; no calf tenderness Neurologic: Awake, alert and oriented; motor function intact in all extremities and symmetric; no facial droop Skin: Warm and dry Psychiatric: Normal mood and affect   RESULTS  Summary of this visit's results, reviewed by myself:   EKG Interpretation  Date/Time:  Tuesday November 05 2016 22:27:24 EST Ventricular Rate:  90 PR Interval:  208 QRS Duration: 76 QT Interval:  340 QTC Calculation: 415 R Axis:   54 Text Interpretation:  Normal sinus rhythm Septal infarct , age undetermined Abnormal ECG Rate is faster Confirmed by Wilburn Keir  MD, Jonny RuizJOHN ([id number]) on 11/06/2016 1:05:22 AM      Laboratory Studies: Results for orders placed or performed during the hospital encounter of 11/05/16 (from the past 24 hour(s))  Basic metabolic panel     Status: None   Collection Time: 11/05/16 11:18 PM  Result Value Ref Range   Sodium 135 135 - 145 mmol/L   Potassium 3.7 3.5 - 5.1 mmol/L   Chloride 102 101 - 111 mmol/L   CO2 26 22 - 32 mmol/L   Glucose, Bld 91 65 - 99 mg/dL   BUN 11 6 - 20 mg/dL   Creatinine, Ser 6.210.70 0.44 - 1.00 mg/dL   Calcium 8.9 8.9 - 30.810.3 mg/dL   GFR calc non Af Amer >60 >60 mL/min   GFR calc Af Amer >60 >60 mL/min   Anion gap 7 5 - 15  CBC  Status: Abnormal   Collection Time: 11/05/16 11:18 PM  Result Value Ref Range   WBC 11.4 (H) 4.0 - 10.5 K/uL   RBC 4.27 3.87 - 5.11 MIL/uL   Hemoglobin 12.8 12.0 - 15.0 g/dL   HCT 16.1 09.6 - 04.5 %   MCV 87.8 78.0 - 100.0 fL   MCH 30.0 26.0 - 34.0 pg   MCHC 34.1 30.0 - 36.0 g/dL   RDW 40.9 81.1 - 91.4 %   Platelets 267 150 - 400 K/uL  Troponin I     Status: None   Collection Time: 11/05/16 11:18 PM  Result Value Ref Range   Troponin I <0.03 <0.03 ng/mL  Troponin I     Status: None   Collection Time: 11/06/16  1:22 AM  Result Value Ref Range   Troponin I  <0.03 <0.03 ng/mL   Imaging Studies: Dg Chest 2 View  Result Date: 11/05/2016 CLINICAL DATA:  Left-sided chest pain onset this evening EXAM: CHEST  2 VIEW COMPARISON:  09/18/2011 FINDINGS: The heart size and mediastinal contours are within normal limits. Both lungs are clear. The visualized skeletal structures are unremarkable. IMPRESSION: No active cardiopulmonary disease. Electronically Signed   By: Tollie Eth M.D.   On: 11/05/2016 23:36    ED COURSE  Nursing notes and initial vitals signs, including pulse oximetry, reviewed.  Vitals:   11/05/16 2227 11/05/16 2229 11/06/16 0030 11/06/16 0100  BP:  146/95 106/74 122/84  Pulse:  94 97 73  Resp:  22 22 25   Temp:  97.8 F (36.6 C)    TempSrc:  Oral    SpO2:  100% 100% 100%  Weight: 265 lb (120.2 kg)     Height: 5\' 2"  (1.575 m)      2:08 AM The patient has been asymptomatic in the ED for several hours. Her symptomatology is atypical for cardiac etiology. Her PERC score is <2%.  PROCEDURES    ED DIAGNOSES     ICD-9-CM ICD-10-CM   1. Atypical chest pain 786.59 R07.89        Paula Libra, MD 11/06/16 [phone]

## 2016-11-06 NOTE — ED Notes (Signed)
Pt verbalizes understanding of d/c instructions and denies any further needs at this time. 

## 2017-02-27 ENCOUNTER — Emergency Department (HOSPITAL_BASED_OUTPATIENT_CLINIC_OR_DEPARTMENT_OTHER): Payer: BC Managed Care – PPO

## 2017-02-27 ENCOUNTER — Emergency Department (HOSPITAL_BASED_OUTPATIENT_CLINIC_OR_DEPARTMENT_OTHER)
Admission: EM | Admit: 2017-02-27 | Discharge: 2017-02-27 | Disposition: A | Discharge status: Home / Self Care | Payer: BC Managed Care – PPO | Attending: Emergency Medicine | Admitting: Emergency Medicine

## 2017-02-27 ENCOUNTER — Encounter (HOSPITAL_BASED_OUTPATIENT_CLINIC_OR_DEPARTMENT_OTHER): Payer: Self-pay

## 2017-02-27 DIAGNOSIS — S3992XA Unspecified injury of lower back, initial encounter: Secondary | ICD-10-CM | POA: Insufficient documentation

## 2017-02-27 DIAGNOSIS — Y999 Unspecified external cause status: Secondary | ICD-10-CM | POA: Diagnosis not present

## 2017-02-27 DIAGNOSIS — Y939 Activity, unspecified: Secondary | ICD-10-CM | POA: Diagnosis not present

## 2017-02-27 DIAGNOSIS — Y92410 Unspecified street and highway as the place of occurrence of the external cause: Secondary | ICD-10-CM | POA: Insufficient documentation

## 2017-02-27 MED ORDER — IBUPROFEN 400 MG PO TABS
600.0000 mg | ORAL_TABLET | Freq: Once | ORAL | Status: AC
  Administered 2017-02-27: 22:00:00 600 mg via ORAL
  Filled 2017-02-27: qty 1

## 2017-02-27 MED ORDER — IBUPROFEN 600 MG PO TABS: 600.0000 mg | ORAL_TABLET | Freq: Four times a day (QID) | ORAL | 0 refills | Status: DC | PRN

## 2017-02-27 MED ORDER — METHOCARBAMOL 500 MG PO TABS: 500.0000 mg | ORAL_TABLET | Freq: Two times a day (BID) | ORAL | 0 refills | Status: DC

## 2017-02-27 NOTE — Discharge Instructions (Signed)
Take your medications as prescribed. I also recommend applying ice and/or heat to affected area for 15-20 minutes 3-4 times daily for additional pain relief. Refrain from doing any heavy lifting, squatting or repetitive movements that exacerbate your symptoms. °Follow-up with your primary care provider in the next week if her symptoms have not improved.  °Please return to the Emergency Department if symptoms worsen or new onset of fever, numbness, tingling, groin anesthesia, loss of bowel or bladder, weakness, chest pain, difficulty breathing, abdominal pain. °
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
## 2017-12-12 ENCOUNTER — Emergency Department (HOSPITAL_COMMUNITY): Payer: BC Managed Care – PPO

## 2017-12-12 ENCOUNTER — Other Ambulatory Visit: Payer: Self-pay

## 2017-12-12 ENCOUNTER — Encounter (HOSPITAL_COMMUNITY): Payer: Self-pay

## 2017-12-12 DIAGNOSIS — Z79899 Other long term (current) drug therapy: Secondary | ICD-10-CM | POA: Diagnosis not present

## 2017-12-12 DIAGNOSIS — R079 Chest pain, unspecified: Secondary | ICD-10-CM | POA: Diagnosis not present

## 2017-12-12 LAB — I-STAT BETA HCG BLOOD, ED (MC, WL, AP ONLY): I-stat hCG, quantitative: <5 m[IU]/mL (ref <5)

## 2017-12-12 LAB — CBC
HEMATOCRIT: 39.7 % (ref 36.0–46.0)
HEMOGLOBIN: 13.2 g/dL (ref 12.0–15.0)
MCH: 29.5 pg (ref 26.0–34.0)
MCHC: 33.2 g/dL (ref 30.0–36.0)
MCV: 88.6 fL (ref 78.0–100.0)
Platelets: 274 10*3/uL (ref 150–400)
RBC: 4.48 MIL/uL (ref 3.87–5.11)
RDW: 12.1 % (ref 11.5–15.5)
WBC: 10 10*3/uL (ref 4.0–10.5)

## 2017-12-12 LAB — BASIC METABOLIC PANEL
ANION GAP: 10 (ref 5–15)
BUN: 5 mg/dL — ABNORMAL LOW (ref 6–20)
CO2: 23 mmol/L (ref 22–32)
Calcium: 9.1 mg/dL (ref 8.9–10.3)
Chloride: 105 mmol/L (ref 101–111)
Creatinine, Ser: 0.67 mg/dL (ref 0.44–1.00)
Glucose, Bld: 92 mg/dL (ref 65–99)
POTASSIUM: 4.2 mmol/L (ref 3.5–5.1)
Sodium: 138 mmol/L (ref 135–145)

## 2017-12-12 LAB — I-STAT TROPONIN, ED: TROPONIN I, POC: 0 ng/mL (ref 0.00–0.08)

## 2017-12-12 NOTE — ED Triage Notes (Signed)
[facility] pain that started yesterday. Pain is intermittent, substernal pain radiating to her shoulder. Extensive family hx of cardiac hx. 12 lead unremarkable. No SOB. 324 of aspirin given.   18g LAC. 114/77, HR 68, Spo2 100%.

## 2017-12-13 ENCOUNTER — Emergency Department (HOSPITAL_COMMUNITY)
Admission: EM | Admit: 2017-12-13 | Discharge: 2017-12-13 | Disposition: A | Discharge status: Home / Self Care | Payer: BC Managed Care – PPO | Attending: Emergency Medicine | Admitting: Emergency Medicine

## 2017-12-13 DIAGNOSIS — R079 Chest pain, unspecified: Secondary | ICD-10-CM

## 2017-12-13 LAB — I-STAT TROPONIN, ED: Troponin i, poc: 0 ng/mL (ref 0.00–0.08)

## 2017-12-13 NOTE — ED Notes (Signed)
Pt remains in waiting room. Updated on wait for treatment room. 

## 2017-12-13 NOTE — ED Provider Notes (Signed)
[facility]   CSN: [id number] Arrival date & time: 12/12/17  1751     History   Chief Complaint Chief Complaint  Patient presents with  . Chest Pain    HPI Elizabeth Rios is a 42 y.o. female.  Patient is a 42 year old female with no significant past medical history.  She presents today for evaluation of chest discomfort.  This started at approximately 4:00 this afternoon.  Patient states she was at her recently deceased neighbor's house with her family when she began to develop tightness in the front of her chest.  She reports a similar sensation in her left arm but no nausea, shortness of breath, diaphoresis.  She then presented here for further evaluation.  She has no prior cardiac history.  Cardiac risk factors include family history.   The history is provided by the patient.  Chest Pain   This is a new problem. Episode onset: 4 PM. The problem occurs constantly. The problem has been resolved. The pain is present in the substernal region. The pain is mild. The quality of the pain is described as pressure-like. The pain does not radiate. Pertinent negatives include no abdominal pain, no diaphoresis, no nausea and no shortness of breath. She has tried nothing for the symptoms. The treatment provided no relief.    History reviewed. No pertinent past medical history.  There are no active problems to display for this patient.   Past Surgical History:  Procedure Laterality Date  . CESAREAN SECTION      OB History    No data available       Home Medications    Prior to Admission medications   Medication Sig Start Date End Date Taking? Authorizing Provider  ibuprofen (ADVIL,MOTRIN) 600 MG tablet Take 1 tablet (600 mg total) by mouth every 6 (six) hours as needed. 02/27/17   Barrett HenleNadeau, Nicole Elizabeth, PA-C  Liraglutide -Weight Management (SAXENDA [place]) Inject into the skin.    [provider]  methocarbamol (ROBAXIN)  500 MG tablet Take 1 tablet (500 mg total) by mouth 2 (two) times daily. 02/27/17   Barrett HenleNadeau, Nicole Elizabeth, PA-C    Family History History reviewed. No pertinent family history.  Social History Social History   Tobacco Use  . Smoking status: Never Smoker  . Smokeless tobacco: Never Used  Substance Use Topics  . Alcohol use: Yes    Comment: social  . Drug use: No     Allergies   Patient has no known allergies.   Review of Systems Review of Systems  Constitutional: Negative for diaphoresis.  Respiratory: Negative for shortness of breath.   Cardiovascular: Positive for chest pain.  Gastrointestinal: Negative for abdominal pain and nausea.  All other systems reviewed and are negative.    Physical Exam Updated Vital Signs BP 137/79 (BP Location: Right Arm)   Pulse 80   Temp 98.1 F (36.7 C) (Oral)   Resp (!) 21   SpO2 100%   Physical Exam  Constitutional: She is oriented to person, place, and time. She appears well-developed and well-nourished. No distress.  HENT:  Head: Normocephalic and atraumatic.  Neck: Normal range of motion. Neck supple.  Cardiovascular: Normal rate and regular rhythm. Exam reveals no gallop and no friction rub.  No murmur heard. Pulmonary/Chest: Effort normal and breath sounds normal. No respiratory distress. She has no wheezes.  Abdominal: Soft. Bowel sounds are normal. She exhibits no distension. There is no tenderness.  Musculoskeletal: Normal range of  motion.       Right lower leg: She exhibits no edema.       Left lower leg: She exhibits no edema.  Neurological: She is alert and oriented to person, place, and time.  Skin: Skin is warm and dry. She is not diaphoretic.  Nursing note and vitals reviewed.    ED Treatments / Results  Labs (all labs ordered are listed, but only abnormal results are displayed) Labs Reviewed  BASIC METABOLIC PANEL - Abnormal; Notable for the following components:      Result Value   BUN 5 (*)    All  other components within normal limits  CBC  I-STAT TROPONIN, ED  I-STAT BETA HCG BLOOD, ED ([facility], [facility], [facility] ONLY)  I-STAT TROPONIN, ED    EKG  EKG Interpretation  Date/Time:  Friday December 12 2017 18:00:17 EST Ventricular Rate:  65 PR Interval:  210 QRS Duration: 78 QT Interval:  384 QTC Calculation: 399 R Axis:   41 Text Interpretation:  Sinus rhythm with 1st degree A-V block Cannot rule out Anterior infarct , age undetermined Abnormal ECG Confirmed by Geoffery Lyons ([id number]) on 12/13/2017 5:26:30 AM       Radiology Dg Chest 2 View  Result Date: 12/12/2017 CLINICAL DATA:  Chest pain starting yesterday EXAM: CHEST  2 VIEW COMPARISON:  11/05/2016 FINDINGS: The heart size and mediastinal contours are within normal limits. Both lungs are clear. The visualized skeletal structures are unremarkable. IMPRESSION: No active cardiopulmonary disease. Electronically Signed   By: Tollie Eth M.D.   On: 12/12/2017 18:44    Procedures Procedures (including critical care time)  Medications Ordered in ED Medications - No data to display   Initial Impression / Assessment and Plan / ED Course  I have reviewed the triage vital signs and the nursing notes.  Pertinent labs & imaging results that were available during my care of the patient were reviewed by me and considered in my medical decision making (see chart for details).  Patient presents here with complaints of chest discomfort that appears to be noncardiac in nature.  This began after she was in a stressful situation regarding the death of her neighbor.  Her workup reveals no evidence for a cardiac etiology.  Her EKG is unchanged and troponin is negative x2.  She was observed overnight in the emergency department and appears very stable for discharge.  She will be given cardiology clinic follow-up as she may benefit from a stress test.  She does have a strong family history of cardiac disease but no other risk factors.  Final Clinical  Impressions(s) / ED Diagnoses   Final diagnoses:  None    ED Discharge Orders    None       Geoffery Lyons, MD 12/13/17 0700

## 2017-12-13 NOTE — Discharge Instructions (Signed)
Follow-up with cardiology on Monday to arrange a follow-up appointment.  The contact information for the [place] cardiology office has been provided in this discharge summary for you to call and make these arrangements.  Return to the emergency department in the meantime if your symptoms significantly worsen or change.

## 2017-12-22 ENCOUNTER — Encounter: Payer: Self-pay | Admitting: Cardiology

## 2017-12-22 ENCOUNTER — Ambulatory Visit: Payer: BC Managed Care – PPO | Admitting: Cardiology

## 2017-12-22 VITALS — BP 138/88 | HR 62 | Ht 62.0 in | Wt 274.8 lb

## 2017-12-22 DIAGNOSIS — R072 Precordial pain: Secondary | ICD-10-CM | POA: Diagnosis not present

## 2017-12-22 DIAGNOSIS — R002 Palpitations: Secondary | ICD-10-CM | POA: Diagnosis not present

## 2017-12-22 DIAGNOSIS — Z8249 Family history of ischemic heart disease and other diseases of the circulatory system: Secondary | ICD-10-CM | POA: Diagnosis not present

## 2017-12-22 NOTE — Progress Notes (Signed)
PCP: [facility], [facility] Note: Chief Complaint  Patient presents with  . New Patient (Initial Visit)    Pt states no Sx. significant family history of CAD  . Hospitalization Follow-up    ER visit for chest pain and palpitations    HPI: Elizabeth Rios is a 42 y.o. female with a PMH below who presents today for emergency room follow-up after presenting with chest pain.  Elizabeth Rios was seen on December 13, 2017 at [facility] chest pain ER for sudden onset of --chest pain involving left arm.  She ruled out for MI. I guess because of her family history, she is now referred for cardiology evaluation.  Recent Hospitalizations: see above  Studies Personally Reviewed - (if available, images/films reviewed: From Epic Chart or Care Everywhere)  No prior studies  Interval History: Elizabeth Rios is a pleasant morbidly obese woman with no prior cardiac history or significant past medical history who is here as a follow-up from emergency room visit back in February to evaluate chest discomfort.  She had a prolonged episode of chest pain that began about 4:00 in the afternoon there is some relatively significant social stress that day.  She noted tightness in the front of her chest that radiated to her left arm.  Since then she has not had any episodes like that she is says that she occasionally will have times when she like to stop to catch her breath.  She has intermittent episodes of sharp stabbing type discomfort right in the center of her chest that is oftentimes worse with certain movements or deep breaths but not necessarily with exertion.  She actually notes it more when she is lying down or sitting upright.  This has actually been going on for about 4-5 months and the one episode a letter to go to emergency room was probably the worst she had.  Nothing really helped it just resolved spontaneously.  As far as a fluttering sensations she says that they are not prolonged nothing more than  a few seconds to 30 seconds in the most.  Not associated with lightheadedness or dizziness.  No syncope or near syncope, TIA or amaurosis fugax. She has mild puffy edema but no real PND orthopnea. No claudication.  She is really does not do that much in the way of any symptoms. She is a bit concerned because she has been gaining weight over the last year or so -->  despite the fact that she has been trying to watch what she eats and is trying to stay active.   ROS: A comprehensive was performed. Review of Systems  Constitutional: Negative for fever, malaise/fatigue and weight loss (Notable weight gain).  HENT: Negative for congestion (Getting over a cold) and nosebleeds.   Respiratory: Positive for cough (Getting over cold).   Cardiovascular: Positive for chest pain and palpitations.       Per HPI  Gastrointestinal: Negative for blood in stool, constipation, heartburn and melena.  Genitourinary: Negative for hematuria.  Musculoskeletal: Negative.   Neurological: Negative for dizziness and weakness.  Endo/Heme/Allergies: Negative for environmental allergies.       Intermittent hot flushes  Psychiatric/Behavioral: Negative.   All other systems reviewed and are negative.   I have reviewed and (if needed) personally updated the patient's problem list, medications, allergies, past medical and surgical history, social and family history.   History reviewed. No pertinent past medical history.  Past Surgical History:  Procedure Laterality Date  . CESAREAN SECTION  Current Meds  Medication Sig  . ibuprofen (ADVIL,MOTRIN) 600 MG tablet Take 1 tablet (600 mg total) by mouth every 6 (six) hours as needed.  . [DISCONTINUED] methocarbamol (ROBAXIN) 500 MG tablet Take 1 tablet (500 mg total) by mouth 2 (two) times daily.  - not taking  No Known Allergies   Social History   Tobacco Use  . Smoking status: Never Smoker  . Smokeless tobacco: Never Used  Substance Use Topics  . Alcohol  use: Yes    Comment: social  . Drug use: No   Social History   Social History Narrative  . Not on file    family history includes Diabetes Mellitus II in her brother and father; Diabetes type II in her mother; Heart attack (age of onset: 1526) in her brother; Heart attack (age of onset: 7350) in her mother; Heart attack (age of onset: 2256) in her father.  Wt Readings from Last 3 Encounters:  12/22/17 274 lb 12.8 oz (124.6 kg)  11/05/16 265 lb (120.2 kg)  03/20/16 220 lb (99.8 kg)    PHYSICAL EXAM BP 138/88   Pulse 62   Ht 5\' 2"  (1.575 m)   Wt 274 lb 12.8 oz (124.6 kg)   BMI 50.26 kg/m  Physical Exam  Constitutional: She is oriented to person, place, and time. She appears well-nourished.  Well-groomed.  Morbidly obese.  HENT:  Head: Normocephalic and atraumatic.  Mouth/Throat: No oropharyngeal exudate.  Eyes: Conjunctivae and EOM are normal. Pupils are equal, round, and reactive to light.  Neck: Normal range of motion. Neck supple. No JVD (Cannot assess due to body habitus) present. Carotid bruit is not present. No thyromegaly (Cannot assess due to body habitus) present.  Cardiovascular: Normal rate, regular rhythm and normal pulses.  Occasional extrasystoles are present. PMI is not displaced (Cannot palpate). Exam reveals distant heart sounds.  Really cannot hear adequate heart sounds to determine the possibility of murmur.  Pulmonary/Chest: Effort normal and breath sounds normal. No respiratory distress. She has no wheezes. She has no rales. She exhibits no tenderness (Point tenderness along the sternal border on both sides.).  Somewhat distant breath sounds  Abdominal: Soft. Bowel sounds are normal. She exhibits no distension. There is no tenderness. There is no rebound.  Cannot palpate HSM due to obesity  Musculoskeletal: Normal range of motion. She exhibits no edema (Cannot tell if his edema or just simply obesity.).  Neurological: She is alert and oriented to person, place,  and time. No cranial nerve deficit.  Skin: Skin is warm and dry.  Psychiatric: She has a normal mood and affect. Her behavior is normal. Judgment and thought content normal.  Nursing note and vitals reviewed.   Adult ECG Report --From ER visit the Rate: 65 ;  Rhythm: normal sinus rhythm; normal axis, intervals and durations.  Poor R wave progression (computer reads as cannot exclude anterior infarct, age undetermined, not inaccurate read) relatively normal  Narrative Interpretation: normal EKG   Other studies Reviewed: Additional studies/ records that were reviewed today include:  Recent Labs:   Lab Results  Component Value Date   CREATININE 0.67 12/12/2017   BUN 5 (L) 12/12/2017   NA 138 12/12/2017   K 4.2 12/12/2017   CL 105 12/12/2017   CO2 23 12/12/2017   No results found for: HGBA1C No results found for: CHOL, HDL, LDLCALC, LDLDIRECT, TRIG, CHOLHDL  ASSESSMENT / PLAN: Problem List Items Addressed This Visit    Family history of premature CAD  She is quite worried about her family history and wants to know a good baseline study.  I think her pain really sounds musculoskeletal to me and not necessarily cardiac.  However there is concern with her family history since especially her brother.  I think a baseline screening evaluation with a coronary calcium score to clarify the presence of significant CAD is a reasonable option. Otherwise we could consider a GXT which would probably not be all that helpful as far as risk stratification. Plan: Cardiac Calcium Score      Relevant Orders   CT CARDIAC SCORING   Heart palpitations    Her fluttering episodes may or may not be related to the sharp discomfort in her chest, but there is some that just bothers her and she is not sure what the best thing to do about it is.  She is concerned that she may have an arrhythmia because she has friends that have had atrial fibrillation and another one with SVT.  Plan: Based on the frequency of  her episodes, I think we would not be a capture it guaranteed on a 24 or 48-hour monitor.  Therefore we will do with 30-day event monitor.       Relevant Orders   CARDIAC EVENT MONITOR   CT CARDIAC SCORING   Precordial chest pain - Primary    Again precordial pain that sounds more consistent with musculoskeletal pain there is also some mild reproducibility on exam.  However she is a very significant family history and has not really been necessarily evaluated cardiac risk factor standpoint as far as lipids and glycemic control.  Plan: Coronary calcium score.  If abnormal would go further with either imaging stress test or coronary CTA.        Relevant Orders   CARDIAC EVENT MONITOR   CT CARDIAC SCORING      Current medicines are reviewed at length with the patient today. (+/- concerns) none The following changes have been made:None  Patient Instructions  NO MEDICATION CHANGES  Your physician recommends that you schedule a follow-up appointment in 2 months with Dr Herbie Baltimore   TEST SCHEDULE AT [address] Your physician has recommended that you wear an event monitor 30 DAY . Event monitors are medical devices that record the heart's electrical activity. Doctors most often [place] these monitors to diagnose arrhythmias. Arrhythmias are problems with the speed or rhythm of the heartbeat. The monitor is a small, portable device. You can wear one while you do your normal daily activities. This is usually used to diagnose what is causing palpitations/syncope (passing out).  AND  ordered a CT coronary calcium score. This test is done at [address]. This test is not covered with insurance. There is a $150 cost that is due at the time of the test.    Studies Ordered:   Orders Placed This Encounter  Procedures  . CT CARDIAC SCORING  . CARDIAC EVENT MONITOR      Bryan Lemma, M.D., M.S. Interventional Cardiologist   Pager #  [phone] Phone # [phone] [address] [place], [place] [address]   Thank you for choosing [facility] at [facility]!!

## 2017-12-22 NOTE — Patient Instructions (Addendum)
NO MEDICATION CHANGES  Your physician recommends that you schedule a follow-up appointment in 2 months with Dr Herbie BaltimoreHarding   TEST SCHEDULE AT [address] Your physician has recommended that you wear an event monitor 30 DAY . Event monitors are medical devices that record the heart's electrical activity. Doctors most often us these monitors to diagnose arrhythmias. Arrhythmias are problems with the speed or rhythm of the heartbeat. The monitor is a small, portable device. You can wear one while you do your normal daily activities. This is usually used to diagnose what is causing palpitations/syncope (passing out).  AND  ordered a CT coronary calcium score. This test is done at [address]. This test is not covered with insurance. There is a $150 cost that is due at the time of the test.   Coronary CalciumScan A coronary calcium scan is an imaging test used to look for deposits of calcium and other fatty materials (plaques) in the inner lining of the blood vessels of the heart (coronary arteries). These deposits of calcium and plaques can partly clog and narrow the coronary arteries without producing any symptoms or warning signs. This puts a person at risk for a heart attack. This test can detect these deposits before symptoms develop. Tell a health care provider about:  Any allergies you have.  All medicines you are taking, including vitamins, herbs, eye drops, creams, and over-the-counter medicines.  Any problems you or family members have had with anesthetic medicines.  Any blood disorders you have.  Any surgeries you have had.  Any medical conditions you have.  Whether you are pregnant or may be pregnant. What are the risks? Generally, this is a safe procedure. However, problems may occur, including:  Harm to a pregnant woman and her unborn baby. This test involves the use of radiation. Radiation exposure can be dangerous to a pregnant woman  and her unborn baby. If you are pregnant, you generally should not have this procedure done.  Slight increase in the risk of cancer. This is because of the radiation involved in the test. What happens before the procedure? No preparation is needed for this procedure. What happens during the procedure?  You will undress and remove any jewelry around your neck or chest.  You will put on a hospital gown.  Sticky electrodes will be placed on your chest. The electrodes will be connected to an electrocardiogram (ECG) machine to record a tracing of the electrical activity of your heart.  A CT scanner will take pictures of your heart. During this time, you will be asked to lie still and hold your breath for 2-3 seconds while a picture of your heart is being taken. The procedure may vary among health care providers and hospitals. What happens after the procedure?  You can get dressed.  You can return to your normal activities.  It is up to you to get the results of your test. Ask your health care provider, or the department that is doing the test, when your results will be ready. Summary  A coronary calcium scan is an imaging test used to look for deposits of calcium and other fatty materials (plaques) in the inner lining of the blood vessels of the heart (coronary arteries).  Generally, this is a safe procedure. Tell your health care provider if you are pregnant or may be pregnant.  No preparation is needed for this procedure.  A CT scanner will take pictures of your heart.  You can return to your normal activities after the scan is done. This information is not intended to replace advice given to you by your health care provider. Make sure you discuss any questions you have with your health care provider. Document Released: 04/04/2008 Document Revised: 08/26/2016 Document Reviewed: 08/26/2016 Elsevier Interactive Patient Education  2017 [employer].

## 2017-12-25 ENCOUNTER — Encounter: Payer: Self-pay | Admitting: Cardiology

## 2017-12-25 NOTE — Assessment & Plan Note (Signed)
Again precordial pain that sounds more consistent with musculoskeletal pain there is also some mild reproducibility on exam.  However she is a very significant family history and has not really been necessarily evaluated cardiac risk factor standpoint as far as lipids and glycemic control.  Plan: Coronary calcium score.  If abnormal would go further with either imaging stress test or coronary CTA.

## 2017-12-25 NOTE — Assessment & Plan Note (Signed)
She is quite worried about her family history and wants to know a good baseline study.  I think her pain really sounds musculoskeletal to me and not necessarily cardiac.  However there is concern with her family history since especially her brother.  I think a baseline screening evaluation with a coronary calcium score to clarify the presence of significant CAD is a reasonable option. Otherwise we could consider a GXT which would probably not be all that helpful as far as risk stratification. Plan: Cardiac Calcium Score

## 2017-12-25 NOTE — Assessment & Plan Note (Signed)
Her fluttering episodes may or may not be related to the sharp discomfort in her chest, but there is some that just bothers her and she is not sure what the best thing to do about it is.  She is concerned that she may have an arrhythmia because she has friends that have had atrial fibrillation and another one with SVT.  Plan: Based on the frequency of her episodes, I think we would not be a capture it guaranteed on a 24 or 48-hour monitor.  Therefore we will do with 30-day event monitor.

## 2017-12-30 ENCOUNTER — Ambulatory Visit: Payer: Self-pay | Admitting: Cardiology

## 2018-01-05 ENCOUNTER — Ambulatory Visit (INDEPENDENT_AMBULATORY_CARE_PROVIDER_SITE_OTHER): Payer: BC Managed Care – PPO

## 2018-01-05 DIAGNOSIS — R072 Precordial pain: Secondary | ICD-10-CM

## 2018-01-05 DIAGNOSIS — R002 Palpitations: Secondary | ICD-10-CM | POA: Diagnosis not present

## 2018-01-19 ENCOUNTER — Other Ambulatory Visit: Payer: Self-pay

## 2018-01-21 LAB — CYTOLOGY - PAP
ADEQUACY: ABSENT
DIAGNOSIS: NEGATIVE

## 2018-01-22 ENCOUNTER — Telehealth: Payer: Self-pay | Admitting: Cardiology

## 2018-01-22 NOTE — Telephone Encounter (Signed)
Called to give patient the appointment date and time for her Calcium Scoring ordered by Dr. Herbie BaltimoreHarding.  Scheduled for 02/02/18 @ 1:30pm----pt asked about the fee---$150.00---she states she will call and reschedule.

## 2018-01-24 ENCOUNTER — Other Ambulatory Visit: Payer: Self-pay

## 2018-01-24 ENCOUNTER — Encounter (HOSPITAL_BASED_OUTPATIENT_CLINIC_OR_DEPARTMENT_OTHER): Payer: Self-pay | Admitting: Emergency Medicine

## 2018-01-24 ENCOUNTER — Emergency Department (HOSPITAL_BASED_OUTPATIENT_CLINIC_OR_DEPARTMENT_OTHER)
Admission: EM | Admit: 2018-01-24 | Discharge: 2018-01-24 | Disposition: A | Discharge status: Home / Self Care | Payer: BC Managed Care – PPO | Attending: Emergency Medicine | Admitting: Emergency Medicine

## 2018-01-24 ENCOUNTER — Emergency Department (HOSPITAL_BASED_OUTPATIENT_CLINIC_OR_DEPARTMENT_OTHER): Payer: BC Managed Care – PPO

## 2018-01-24 DIAGNOSIS — R0789 Other chest pain: Secondary | ICD-10-CM | POA: Insufficient documentation

## 2018-01-24 DIAGNOSIS — R1013 Epigastric pain: Secondary | ICD-10-CM

## 2018-01-24 DIAGNOSIS — R1011 Right upper quadrant pain: Secondary | ICD-10-CM | POA: Diagnosis present

## 2018-01-24 LAB — BASIC METABOLIC PANEL
Anion gap: 6 (ref 5–15)
BUN: 8 mg/dL (ref 6–20)
CHLORIDE: 107 mmol/L (ref 101–111)
CO2: 24 mmol/L (ref 22–32)
Calcium: 8.9 mg/dL (ref 8.9–10.3)
Creatinine, Ser: 0.69 mg/dL (ref 0.44–1.00)
GFR calc non Af Amer: >60 mL/min (ref >60)
Glucose, Bld: 96 mg/dL (ref 65–99)
POTASSIUM: 3.7 mmol/L (ref 3.5–5.1)
SODIUM: 137 mmol/L (ref 135–145)

## 2018-01-24 LAB — HEPATIC FUNCTION PANEL
ALT: 28 U/L (ref 14–54)
AST: 24 U/L (ref 15–41)
Albumin: 3.9 g/dL (ref 3.5–5.0)
Alkaline Phosphatase: 54 U/L (ref 38–126)
BILIRUBIN INDIRECT: 0.8 mg/dL (ref 0.3–0.9)
Bilirubin, Direct: 0.1 mg/dL (ref 0.1–0.5)
TOTAL PROTEIN: 7.5 g/dL (ref 6.5–8.1)
Total Bilirubin: 0.9 mg/dL (ref 0.3–1.2)

## 2018-01-24 LAB — PREGNANCY, URINE: PREG TEST UR: NEGATIVE

## 2018-01-24 LAB — CBC
HCT: 39.2 % (ref 36.0–46.0)
Hemoglobin: 13.4 g/dL (ref 12.0–15.0)
MCH: 29.8 pg (ref 26.0–34.0)
MCHC: 34.2 g/dL (ref 30.0–36.0)
MCV: 87.1 fL (ref 78.0–100.0)
PLATELETS: 274 10*3/uL (ref 150–400)
RBC: 4.5 MIL/uL (ref 3.87–5.11)
RDW: 12 % (ref 11.5–15.5)
WBC: 9.2 10*3/uL (ref 4.0–10.5)

## 2018-01-24 LAB — LIPASE, BLOOD: LIPASE: 32 U/L (ref 11–51)

## 2018-01-24 LAB — TROPONIN I: Troponin I: <0.03 ng/mL (ref <0.03)

## 2018-01-24 MED ORDER — PANTOPRAZOLE SODIUM 40 MG IV SOLR
40.0000 mg | Freq: Once | INTRAVENOUS | Status: AC
  Administered 2018-01-24: 40 mg via INTRAVENOUS
  Filled 2018-01-24: qty 40

## 2018-01-24 MED ORDER — TRAMADOL HCL 50 MG PO TABS: ORAL_TABLET | ORAL | 0 refills | Status: DC

## 2018-01-24 MED ORDER — PANTOPRAZOLE SODIUM 20 MG PO TBEC: 20.0000 mg | DELAYED_RELEASE_TABLET | Freq: Every day | ORAL | 0 refills | Status: DC

## 2018-01-24 MED ORDER — GI COCKTAIL ~~LOC~~
30.0000 mL | Freq: Once | ORAL | Status: AC
  Administered 2018-01-24: 30 mL via ORAL
  Filled 2018-01-24: qty 30

## 2018-01-24 MED ORDER — FAMOTIDINE IN NACL 20-0.9 MG/50ML-% IV SOLN
20.0000 mg | Freq: Once | INTRAVENOUS | Status: AC
  Administered 2018-01-24: 20 mg via INTRAVENOUS
  Filled 2018-01-24: qty 50

## 2018-01-24 NOTE — ED Triage Notes (Signed)
Sharp chest pain x 30 min to central chest. Pt reports this has happened intermittently over the past few weeks and she has been wearing a heart monitor and has seen a cardiologist

## 2018-01-24 NOTE — ED Notes (Signed)
Pt given gingerale per MD approval.

## 2018-01-24 NOTE — Discharge Instructions (Signed)
1.  Take Protonix daily. 2.  Follow gallbladder diet. 3.  Take tramadol if needed for additional pain control. 4.  Follow-up with your cardiologist for recheck. 5.  Follow-up with your family doctor to discuss further testing for the gallbladder if symptoms are persisting.

## 2018-01-24 NOTE — ED Notes (Signed)
Pt unable to void. Will call when able.

## 2018-01-24 NOTE — ED Provider Notes (Signed)
[facility] EMERGENCY DEPARTMENT Provider Note   CSN: [id number] Arrival date & time: 01/24/18  1433     History   Chief Complaint Chief Complaint  Patient presents with  . Chest Pain    HPI Elizabeth Rios is a 42 y.o. female.  HPI Patient developed sharp pains in her epigastrium about an hour prior to arrival.  She reports they seem to be in a kind of "L-shaped from her right upper quadrant into her epigastrium and lower chest.  Where she feels somewhat short of breath 2.  No cough, no fever, no sputum production.  No lower extremity swelling or calf pain.  No recent diarrheal or vomiting illness.  Patient denies frequent problems with pain associated with eating.  She did eat [employer] meal about 2 hours prior to pain onset.  Patient is currently wearing a Holter monitor.  She reports as because when she was seen at the [facility], they saw signs of a heart attack and referred her to the heart center. History reviewed. No pertinent past medical history.  Patient Active Problem List   Diagnosis Date Noted  . Precordial chest pain 12/22/2017  . Family history of premature CAD 12/22/2017  . Heart palpitations 12/22/2017    Past Surgical History:  Procedure Laterality Date  . CESAREAN SECTION       OB History   None      Home Medications    Prior to Admission medications   Medication Sig Start Date End Date Taking? Authorizing Provider  ibuprofen (ADVIL,MOTRIN) 600 MG tablet Take 1 tablet (600 mg total) by mouth every 6 (six) hours as needed. 02/27/17   Barrett Henle, PA-C  pantoprazole (PROTONIX) 20 MG tablet Take 1 tablet (20 mg total) by mouth daily. 01/24/18   Arby Barrette, MD  traMADol Janean Sark) 50 MG tablet 1-2 every 6 hours as needed 01/24/18   Arby Barrette, MD    Family History Family History  Problem Relation Age of Onset  . Heart attack Mother 50       x 4  . Diabetes type II Mother   . Heart attack Father 56       x 1  .  Diabetes Mellitus II Father   . Heart attack Brother 26  . Diabetes Mellitus II Brother        Ended up with Pnacreatitis & BLE PAD amputations - complicatios of DM>     Social History Social History   Tobacco Use  . Smoking status: Never Smoker  . Smokeless tobacco: Never Used  Substance Use Topics  . Alcohol use: Yes    Comment: social  . Drug use: No     Allergies   Patient has no known allergies.   Review of Systems Review of Systems 10 Systems reviewed and are negative for acute change except as noted in the HPI.   Physical Exam Updated Vital Signs BP 117/77   Pulse 75   Temp 98.3 F (36.8 C) (Oral)   Resp 16   Ht 5\' 2"  (1.575 m)   Wt 119.3 kg (263 lb)   SpO2 100%   BMI 48.10 kg/m   Physical Exam  Constitutional: She is oriented to person, place, and time. She appears well-developed and well-nourished. No distress.  No respiratory distress, well appearance, central obesity  HENT:  Head: Normocephalic and atraumatic.  Eyes: EOM are normal.  Neck: Neck supple.  Cardiovascular: Normal rate, regular rhythm, normal heart sounds and intact distal pulses.  Pulmonary/Chest: Effort normal and breath sounds normal.  Abdominal: Soft. Bowel sounds are normal. She exhibits no distension. There is tenderness.  Epigastrium and right upper quadrant tender to palpation without guarding.  Musculoskeletal: Normal range of motion. She exhibits no edema or tenderness.  Neurological: She is alert and oriented to person, place, and time. She has normal strength. No cranial nerve deficit. She exhibits normal muscle tone. Coordination normal. GCS eye subscore is 4. GCS verbal subscore is 5. GCS motor subscore is 6.  Skin: Skin is warm, dry and intact.  Psychiatric: She has a normal mood and affect.     ED Treatments / Results  Labs (all labs ordered are listed, but only abnormal results are displayed) Labs Reviewed  BASIC METABOLIC PANEL  CBC  TROPONIN I  PREGNANCY,  URINE  LIPASE, BLOOD  HEPATIC FUNCTION PANEL  TROPONIN I    EKG EKG Interpretation  Date/Time:  Saturday January 24 2018 14:39:27 EDT Ventricular Rate:  101 PR Interval:  162 QRS Duration: 74 QT Interval:  342 QTC Calculation: 443 R Axis:   60 Text Interpretation:  Sinus tachycardia Otherwise normal ECG no sig change from previous Confirmed by Arby BarrettePfeiffer, Marland Reine [phone]([id number]) on 01/24/2018 3:01:11 PM   Radiology Dg Chest 2 View  Result Date: 01/24/2018 CLINICAL DATA:  Mid chest pressure for 2 months. EXAM: CHEST - 2 VIEW COMPARISON:  December 12, 2017 FINDINGS: The heart size and mediastinal contours are within normal limits. Both lungs are clear. The visualized skeletal structures are unremarkable. IMPRESSION: No active cardiopulmonary disease. Electronically Signed   By: Gerome Samavid  Williams III M.D   On: 01/24/2018 15:10    Procedures Procedures (including critical care time)  Medications Ordered in ED Medications  gi cocktail (Maalox,Lidocaine,Donnatal) (30 mLs Oral Given 01/24/18 1515)  famotidine (PEPCID) IVPB 20 mg premix (0 mg Intravenous Stopped 01/24/18 1617)  pantoprazole (PROTONIX) injection 40 mg (40 mg Intravenous Given 01/24/18 1655)     Initial Impression / Assessment and Plan / ED Course  I have reviewed the triage vital signs and the nursing notes.  Pertinent labs & imaging results that were available during my care of the patient were reviewed by me and considered in my medical decision making (see chart for details).      Final Clinical Impressions(s) / ED Diagnoses   Final diagnoses:  Epigastric pain  Atypical chest pain   Patient has clinically well appearance.  2 sets of cardiac enzymes are negative.  No EKG changes.  Pain was reproducible in the epigastrium.  Consideration is given to possible biliary colic, reflux or gastritis.  Labs are within normal limits.  At this time plan will be for empiric Protonix, gallbladder diet and tramadol if needed.  Patient is also  counseled to follow-up with her cardiologist to determine if she should have outpatient stress testing.  Patient is discharged in good condition.  She is alert and appropriate.  Well in appearance with normal vital signs. ED Discharge Orders        Ordered    pantoprazole (PROTONIX) 20 MG tablet  Daily     01/24/18 1855    traMADol (ULTRAM) 50 MG tablet     01/24/18 1855       Arby BarrettePfeiffer, Maliha Outten, MD 01/24/18 [phone]

## 2018-01-24 NOTE — ED Notes (Signed)
ED Provider at bedside. 

## 2018-02-02 ENCOUNTER — Telehealth: Payer: Self-pay | Admitting: *Deleted

## 2018-02-02 ENCOUNTER — Ambulatory Visit (INDEPENDENT_AMBULATORY_CARE_PROVIDER_SITE_OTHER)
Admission: RE | Admit: 2018-02-02 | Discharge: 2018-02-02 | Disposition: A | Discharge status: Home / Self Care | Payer: BC Managed Care – PPO | Source: Ambulatory Visit | Attending: Cardiology | Admitting: Cardiology

## 2018-02-02 DIAGNOSIS — Z8249 Family history of ischemic heart disease and other diseases of the circulatory system: Secondary | ICD-10-CM

## 2018-02-02 DIAGNOSIS — R002 Palpitations: Secondary | ICD-10-CM

## 2018-02-02 DIAGNOSIS — R072 Precordial pain: Secondary | ICD-10-CM

## 2018-02-02 NOTE — Telephone Encounter (Signed)
Incoming monitor strip from [employer] stating that the patient had SVT's on 4/13 with a heart rate of 193. Patient has been called. She stated that she was at a party and dancing at that time and did not feel them. She stated that she feels fine today. Readings have been given to the provider.

## 2018-02-11 ENCOUNTER — Telehealth: Payer: Self-pay | Admitting: *Deleted

## 2018-02-11 NOTE — Telephone Encounter (Signed)
-----   Message from Marykay Lexavid W Harding, MD sent at 02/09/2018  9:12 PM EDT ----- Cardiac Event Monitor  Rare PVCs & PACs -- no PAT, Afib or A Flutter  No Symptoms noted with 2 ~1 min runs of ~SVT vs. S Tachy.  Symptoms noted with and without PVCs or PACs.  Favor Conservative management -- NO truly abnormal findings.  Bryan Lemmaavid Harding, MD  [facility]

## 2018-02-11 NOTE — Telephone Encounter (Signed)
LEFT MESSAGE TO CALL BACK -  RESULT RELEASED TO MYCHART , ANY QUESTION MAY CALL BACK

## 2018-03-10 ENCOUNTER — Ambulatory Visit: Payer: BC Managed Care – PPO | Admitting: Cardiology

## 2019-07-22 IMAGING — CT CT HEART SCORING
2 series · 16 of 20 positions shown, 18 images · non-contrast
Comparison: None.

CLINICAL DATA: Risk stratification

EXAM:
Coronary Calcium Score
TECHNIQUE: The patient was scanned on a Siemens Somatom 64 slice scanner. Axial
non-contrast 3 mm slices were carried out through the heart. The
data set was analyzed on a dedicated work station and scored using
the Agatson method.

[Series 3: casc 3.0 i36f 2 bestdiast 71 % · axial · 0.35mm/px · z∈[+1360,+1466]mm · 8 of 46 slices shown, 10 images]
[im 6/46  vessel]
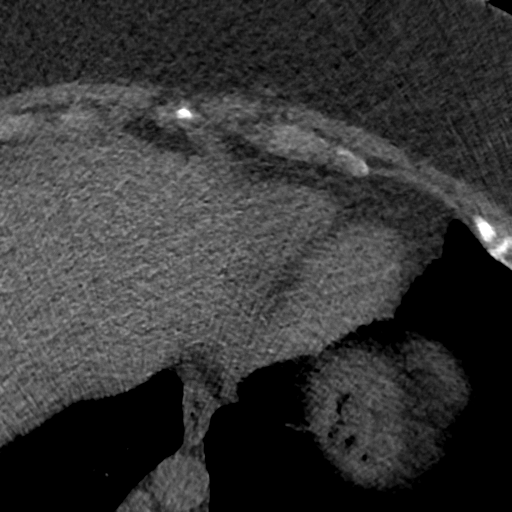
[im 6/46  lung]
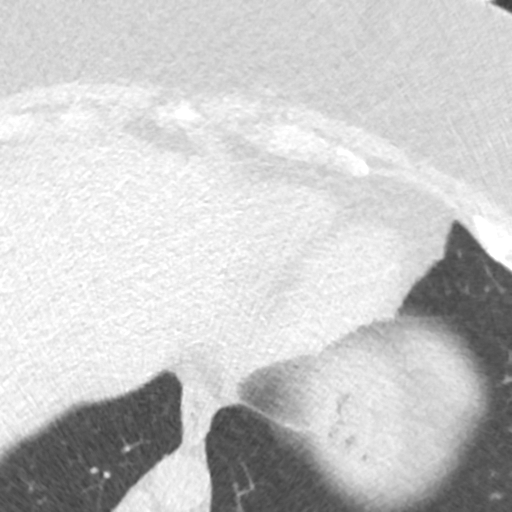
[im 11/46  vessel]
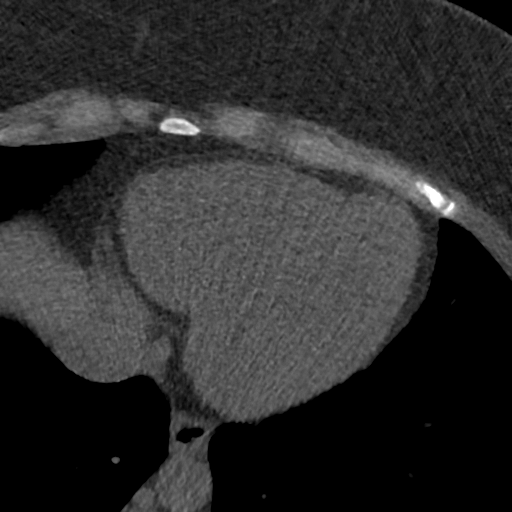
[im 16/46  vessel]
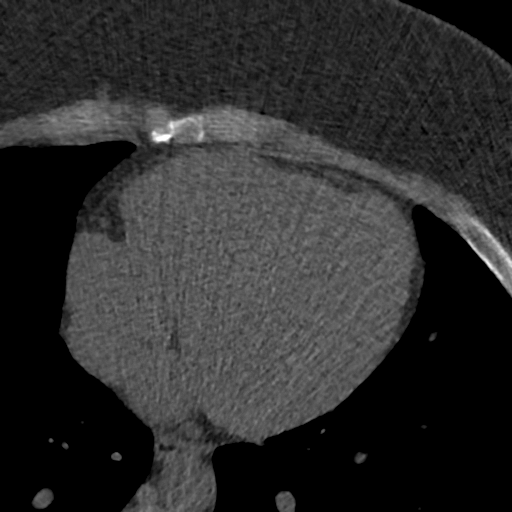
[im 21/46  vessel]
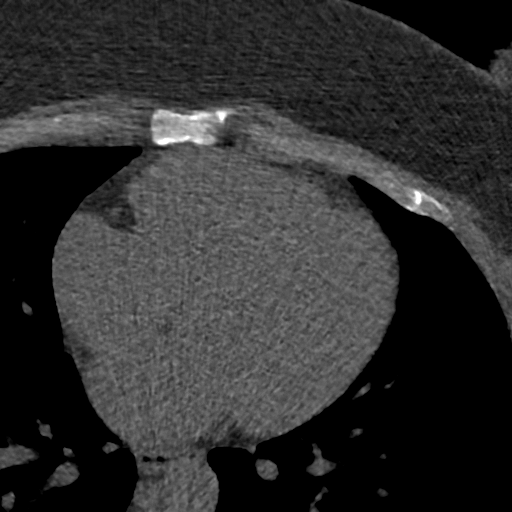
[im 26/46  vessel]
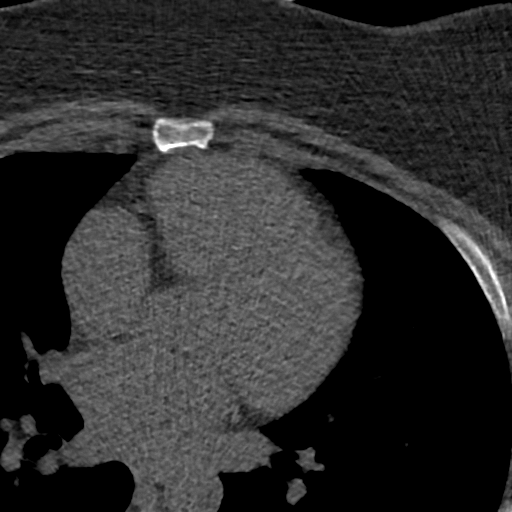
[im 26/46  lung]
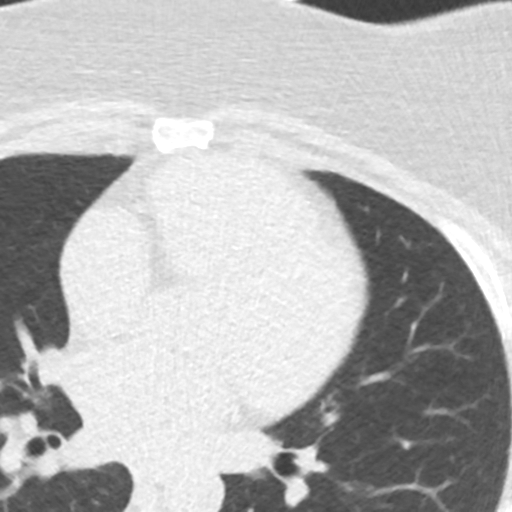
[im 31/46  vessel]
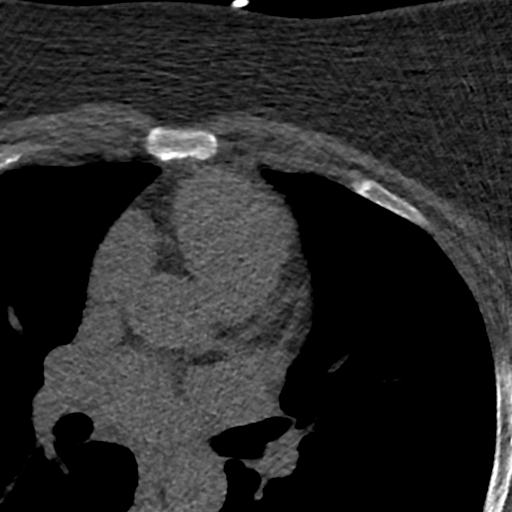
[im 36/46  vessel]
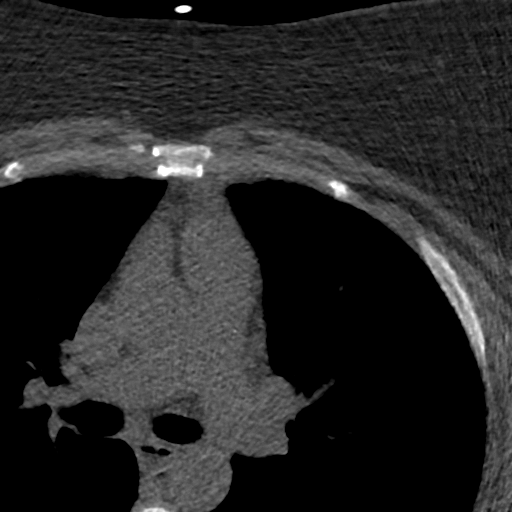
[im 41/46  vessel]
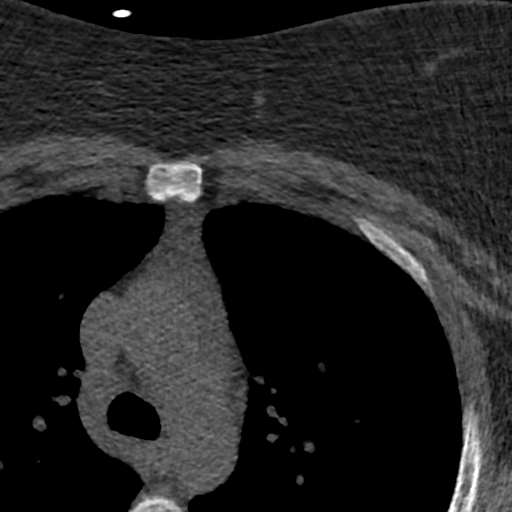

[Series 5: lung st 71 % · axial · 0.72mm/px · z∈[+1358,+1462]mm · 8 of 47 slices shown]
[im 6/47  lung]
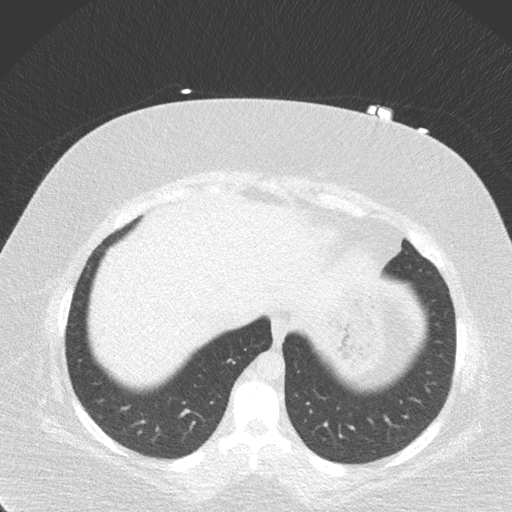
[im 11/47  lung]
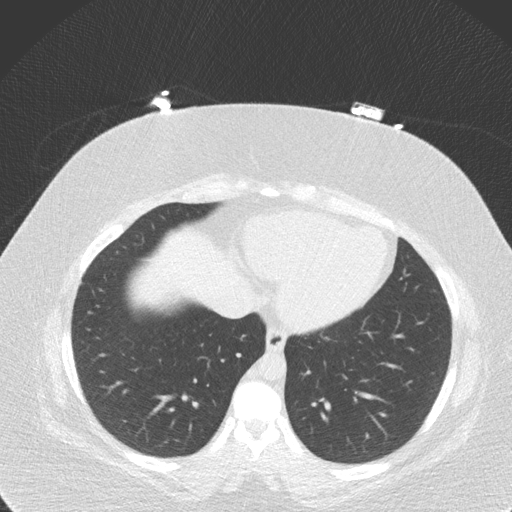
[im 16/47  lung]
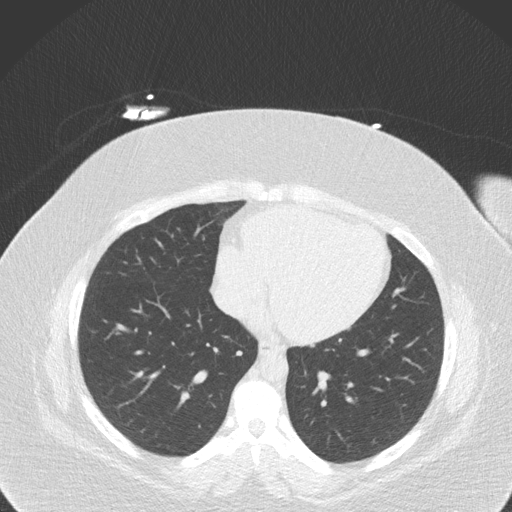
[im 21/47  lung]
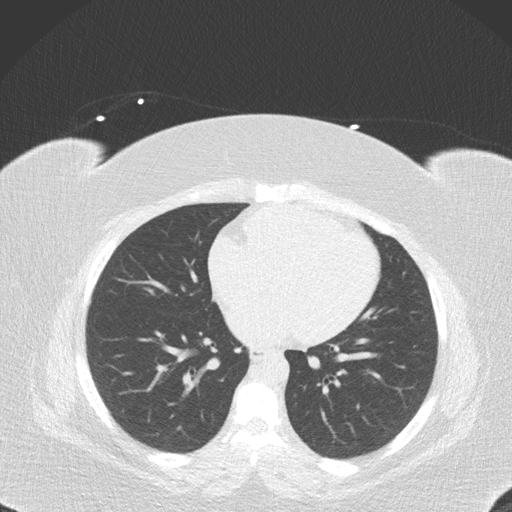
[im 26/47  lung]
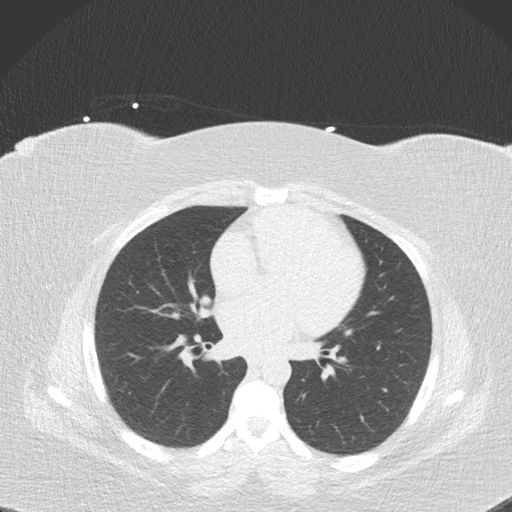
[im 31/47  lung]
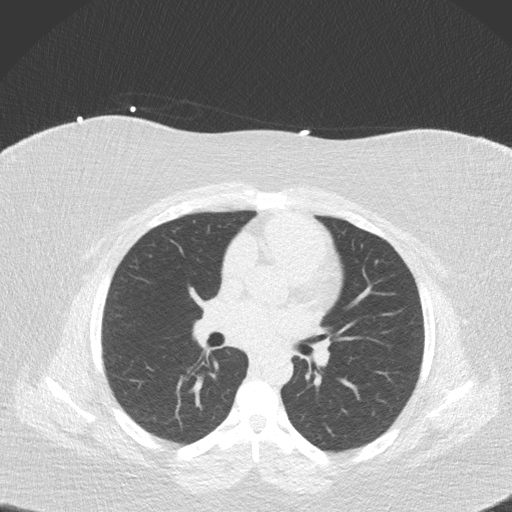
[im 36/47  lung]
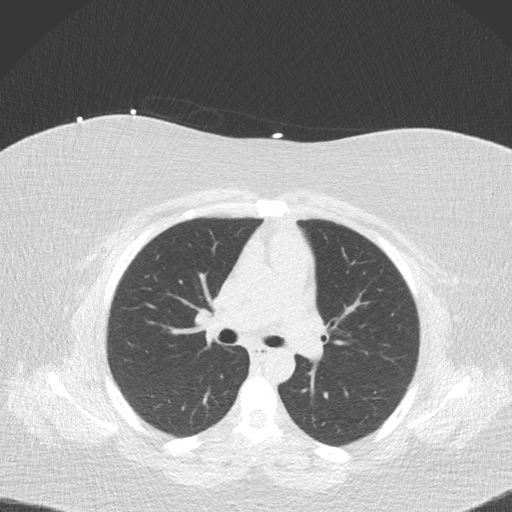
[im 41/47  lung]
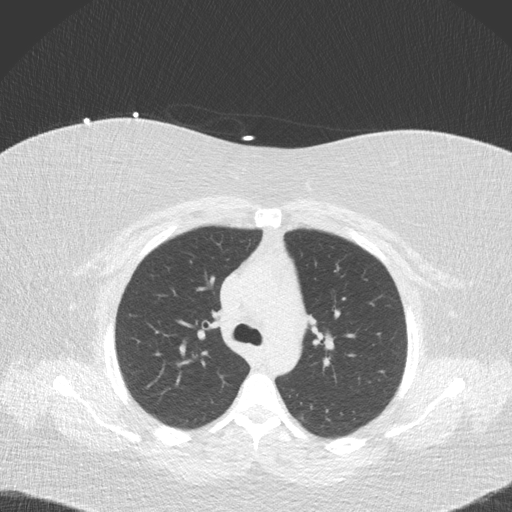

[16 of 20 positions shown; findings below may reference images not displayed]

FINDINGS: Non-cardiac: See separate report from [REDACTED].

Ascending Aorta: Normal diameter 2.8 cm

Pericardium: Normal

Coronary arteries: No calcium detected
IMPRESSION: Coronary calcium score of 0.

Zakiullah Asefa

EXAM:
OVER-READ INTERPRETATION  CT CHEST

The following report is an over-read performed by radiologist Dr.
Akiyuki Rezgui [REDACTED] on 02/02/2018. This over-read
does not include interpretation of cardiac or coronary anatomy or
pathology. The coronary calcium score interpretation by the
cardiologist is attached.
FINDINGS: Vascular: Heart is normal size.  Visualized aorta is normal caliber.

Mediastinum/Nodes: No adenopathy in the lower mediastinum or hila.

Lungs/Pleura: Visualized lungs are clear.  No effusions.

Upper Abdomen: Imaging into the upper abdomen shows no acute
findings.

Musculoskeletal: Chest wall soft tissues are unremarkable. No acute
bony abnormality.
IMPRESSION: No acute or significant extracardiac abnormality.

## 2019-12-08 ENCOUNTER — Emergency Department (HOSPITAL_BASED_OUTPATIENT_CLINIC_OR_DEPARTMENT_OTHER)
Admission: EM | Admit: 2019-12-08 | Discharge: 2019-12-09 | Disposition: A | Discharge status: Home / Self Care | Payer: BC Managed Care – PPO | Attending: Emergency Medicine | Admitting: Emergency Medicine

## 2019-12-08 ENCOUNTER — Other Ambulatory Visit: Payer: Self-pay

## 2019-12-08 ENCOUNTER — Encounter (HOSPITAL_BASED_OUTPATIENT_CLINIC_OR_DEPARTMENT_OTHER): Payer: Self-pay

## 2019-12-08 DIAGNOSIS — M545 Low back pain, unspecified: Secondary | ICD-10-CM

## 2019-12-08 DIAGNOSIS — N899 Noninflammatory disorder of vagina, unspecified: Secondary | ICD-10-CM | POA: Diagnosis present

## 2019-12-08 DIAGNOSIS — N760 Acute vaginitis: Secondary | ICD-10-CM | POA: Diagnosis not present

## 2019-12-08 DIAGNOSIS — Z79899 Other long term (current) drug therapy: Secondary | ICD-10-CM | POA: Insufficient documentation

## 2019-12-08 DIAGNOSIS — B9689 Other specified bacterial agents as the cause of diseases classified elsewhere: Secondary | ICD-10-CM

## 2019-12-08 LAB — URINALYSIS, ROUTINE W REFLEX MICROSCOPIC
Bilirubin Urine: NEGATIVE
Glucose, UA: NEGATIVE mg/dL
Hgb urine dipstick: NEGATIVE
Ketones, ur: NEGATIVE mg/dL
Leukocytes,Ua: NEGATIVE
Nitrite: NEGATIVE
Protein, ur: NEGATIVE mg/dL
Specific Gravity, Urine: 1.02 (ref 1.005–1.030)
pH: 6 (ref 5.0–8.0)

## 2019-12-08 LAB — WET PREP, GENITAL
Sperm: NONE SEEN
Trich, Wet Prep: NONE SEEN
Yeast Wet Prep HPF POC: NONE SEEN

## 2019-12-08 LAB — PREGNANCY, URINE: Preg Test, Ur: NEGATIVE

## 2019-12-08 MED ORDER — METRONIDAZOLE 500 MG PO TABS: 500.0000 mg | ORAL_TABLET | Freq: Two times a day (BID) | ORAL | 0 refills | Status: DC

## 2019-12-08 MED ORDER — CYCLOBENZAPRINE HCL 10 MG PO TABS: 10.0000 mg | ORAL_TABLET | Freq: Two times a day (BID) | ORAL | 0 refills | Status: DC | PRN

## 2019-12-08 MED ORDER — CEFTRIAXONE SODIUM 500 MG IJ SOLR
500.0000 mg | Freq: Once | INTRAMUSCULAR | Status: AC
  Administered 2019-12-08: 500 mg via INTRAMUSCULAR
  Filled 2019-12-08: qty 500

## 2019-12-08 MED ORDER — AZITHROMYCIN 250 MG PO TABS
1000.0000 mg | ORAL_TABLET | Freq: Once | ORAL | Status: AC
  Administered 2019-12-08: 1000 mg via ORAL
  Filled 2019-12-08: qty 4

## 2019-12-08 NOTE — ED Provider Notes (Signed)
[facility] EMERGENCY DEPARTMENT Provider Note   CSN: [id number] Arrival date & time: 12/08/19  1626     History Chief Complaint  Patient presents with  . Vaginal Discharge    Elizabeth Rios is a 44 y.o. female resenting to emergency department today with chief complaint of vaginal discharge x2 days.  She describes the discharge as a thin white consistency.  She denies any associated pelvic pain, vaginal discharge or abnormal vaginal bleeding.  She does states she has been sexually active with a new partner recently.  They use condoms however the condom broke 2 weeks ago.  She is also reporting right lower back pain.  She describes it as feeling like a pulled muscle.  She lifts heavy boxes at work daily but does not remember hurting her back.  She has not taken any medications for symptoms prior to arrival.  She denies any fever, chills, abdominal pain, nausea, vomiting, numbness, tingling, weakness, gross hematuria, diarrhea, urinary retention, bowel or bladder incontinence. No history of IVDU.   History reviewed. No pertinent past medical history.  Patient Active Problem List   Diagnosis Date Noted  . Precordial chest pain 12/22/2017  . Family history of premature CAD 12/22/2017  . Heart palpitations 12/22/2017    Past Surgical History:  Procedure Laterality Date  . CESAREAN SECTION       OB History   No obstetric history on file.     Family History  Problem Relation Age of Onset  . Heart attack Mother 50       x 4  . Diabetes type II Mother   . Heart attack Father 56       x 1  . Diabetes Mellitus II Father   . Heart attack Brother 26  . Diabetes Mellitus II Brother        Ended up with Pnacreatitis & BLE PAD amputations - complicatios of DM>     Social History   Tobacco Use  . Smoking status: Never Smoker  . Smokeless tobacco: Never Used  Substance Use Topics  . Alcohol use: Not Currently  . Drug use: No    Home Medications Prior to Admission  medications   Medication Sig Start Date End Date Taking? Authorizing Provider  cyclobenzaprine (FLEXERIL) 10 MG tablet Take 1 tablet (10 mg total) by mouth 2 (two) times daily as needed for muscle spasms. 12/08/19   Albrizze, Kaitlyn E, PA-C  ibuprofen (ADVIL,MOTRIN) 600 MG tablet Take 1 tablet (600 mg total) by mouth every 6 (six) hours as needed. 02/27/17   Barrett Henle, PA-C  metroNIDAZOLE (FLAGYL) 500 MG tablet Take 1 tablet (500 mg total) by mouth 2 (two) times daily. 12/08/19   Albrizze, Kaitlyn E, PA-C  pantoprazole (PROTONIX) 20 MG tablet Take 1 tablet (20 mg total) by mouth daily. 01/24/18   Arby Barrette, MD  traMADol Janean Sark) 50 MG tablet 1-2 every 6 hours as needed 01/24/18   Arby Barrette, MD    Allergies    Patient has no known allergies.  Review of Systems   Review of Systems  All other systems are reviewed and are negative for acute change except as noted in the HPI.  Physical Exam Updated Vital Signs BP (!) 139/97 (BP Location: Left Arm)   Pulse 79   Temp 98.2 F (36.8 C) (Oral)   Resp 16   Ht 5' (1.524 m)   Wt 112.5 kg   SpO2 100%   BMI 48.43 kg/m   Physical Exam Vitals  and nursing note reviewed.  Constitutional:      General: She is not in acute distress.    Appearance: She is not ill-appearing.  HENT:     Head: Normocephalic and atraumatic.     Right Ear: Tympanic membrane and external ear normal.     Left Ear: Tympanic membrane and external ear normal.     Nose: Nose normal.     Mouth/Throat:     Mouth: Mucous membranes are moist.     Pharynx: Oropharynx is clear.  Eyes:     General: No scleral icterus.       Right eye: No discharge.        Left eye: No discharge.     Extraocular Movements: Extraocular movements intact.     Conjunctiva/sclera: Conjunctivae normal.     Pupils: Pupils are equal, round, and reactive to light.  Neck:     Vascular: No JVD.  Cardiovascular:     Rate and Rhythm: Normal rate and regular rhythm.     Pulses:  Normal pulses.          Radial pulses are 2+ on the right side and 2+ on the left side.     Heart sounds: Normal heart sounds.  Pulmonary:     Comments: Lungs clear to auscultation in all fields. Symmetric chest rise. No wheezing, rales, or rhonchi. Abdominal:     Comments: Abdomen is soft, non-distended, and non-tender in all quadrants. No rigidity, no guarding. No peritoneal signs.  Genitourinary:    Comments: Normal external genitalia. No pain with speculum insertion. Closed cervical os with normal appearance - no rash or lesions. No bleeding noted from cervix or in vaginal vault.  Thin white discharge seen in vaginal vault. On bimanual examination no adnexal tenderness or cervical motion tenderness. Chaperone Jody NT present during exam.  Musculoskeletal:        General: Normal range of motion.     Cervical back: Normal range of motion.     Comments: Full range of motion of the T-spine and L-spine No tenderness to palpation of the spinous processes of the T-spine or L-spine No crepitus, deformity or step-offs Tenderness to palpation of the right  paraspinous muscles of the L-spine   Negative straight leg raise test bilaterally.  Skin:    General: Skin is warm and dry.     Capillary Refill: Capillary refill takes less than 2 seconds.  Neurological:     Mental Status: She is oriented to person, place, and time.     GCS: GCS eye subscore is 4. GCS verbal subscore is 5. GCS motor subscore is 6.     Comments: Fluent speech, no facial droop.  Sensation grossly intact to light touch in the lower extremities bilaterally. No saddle anesthesias. Strength 5/5 with flexion and extension at the bilateral hips, knees, and ankles. No noted gait deficit. Coordination intact with heel to shin testing.  Psychiatric:        Behavior: Behavior normal.     ED Results / Procedures / Treatments   Labs (all labs ordered are listed, but only abnormal results are displayed) Labs Reviewed  WET PREP,  GENITAL - Abnormal; Notable for the following components:      Result Value   Clue Cells Wet Prep HPF POC PRESENT (*)    WBC, Wet Prep HPF POC FEW (*)    All other components within normal limits  PREGNANCY, URINE  URINALYSIS, ROUTINE W REFLEX MICROSCOPIC  RPR  HIV ANTIBODY (ROUTINE  TESTING W REFLEX)  GC/CHLAMYDIA PROBE AMP ([place]) NOT AT [facility]    EKG None  Radiology No results found.  Procedures Procedures (including critical care time)  Medications Ordered in ED Medications  cefTRIAXone (ROCEPHIN) injection 500 mg (has no administration in time range)  azithromycin (ZITHROMAX) tablet 1,000 mg (has no administration in time range)    ED Course  I have reviewed the triage vital signs and the nursing notes.  Pertinent labs & imaging results that were available during my care of the patient were reviewed by me and considered in my medical decision making (see chart for details).    MDM Rules/Calculators/A&P                       44 yo female presents with concerns for possible STD. VSS.  She is very well-appearing, no acute distress.  On exam her lungs are clear to auscultation all fields.  She has right paraspinal muscle tenderness of lumbar spine.  No midline tenderness of cervical, thoracic or lumbar spine.  Negative straight leg raise test.  Pain is reproducible on exam.  Pain seems consistent with MSK etiology.  Exam is not consistent or suggestive of cauda equina. No red flags for back pain. Discussed symptomatic care including muscle relaxers prn.  Patient knows not to work or drive while taking these as they can make her drowsy.  Pt also understands that they have GC/Chlamydia cultures pending and that they will need to inform all sexual partners if results return positive. Pt has been treated prophylacticly with azithromycin and rocephin due to pts history, pelvic exam, and wet prep with increased WBCs. Pt not concerning for PID because hemodynamically stable and no  cervical motion tenderness on pelvic exam. Pt has also been treated with flagyl for Bacterial Vaginosis. Pt has been advised to not drink alcohol while on this medication. Patient to be discharged with instructions to follow up with OBGYN/PCP. Discussed importance of using protection when sexually active.     Portions of this note were generated with [employer]. Dictation errors may occur despite best attempts at proofreading.    Final Clinical Impression(s) / ED Diagnoses Final diagnoses:  BV (bacterial vaginosis)  Acute right-sided low back pain without sciatica    Rx / DC Orders ED Discharge Orders         Ordered    cyclobenzaprine (FLEXERIL) 10 MG tablet  2 times daily PRN     12/08/19 2129    metroNIDAZOLE (FLAGYL) 500 MG tablet  2 times daily     12/08/19 2129           Flint Melter 12/08/19 2139    Wyvonnia Dusky, MD 12/09/19 [phone]

## 2019-12-08 NOTE — ED Triage Notes (Signed)
C/o vaginal d/c, lower bad and back pain-concerned she may have a STD due to new sexual partner-NAD-steady gait

## 2019-12-08 NOTE — Discharge Instructions (Addendum)
You have been seen today for vaginal discharge, back pain. Please read and follow all provided instructions. Return to the emergency room for worsening condition or new concerning symptoms.    1. Medications:  Prescription sent to your pharmacy for Flagyl.  This is an antibiotic used to treat bacterial vaginosis.  Please take this as prescribed.  Do not drink alcohol while taking this medication as it will make you violently ill. -Prescription also sent for Flexeril.  This is a muscle relaxer.  Please take this as we discussed.  Most people take this before bed.  It will make you drowsy so you cannot work or drive when taking this medication.  Continue usual home medications Take medications as prescribed. Please review all of the medicines and only take them if you do not have an allergy to them.   2. Treatment: rest, drink plenty of fluids  3. Follow Up:  Please follow up with primary care provider by scheduling an appointment as soon as possible for a visit  If you do not have a primary care physician, contact [facility] at [phone] for referral   It is also a possibility that you have an allergic reaction to any of the medicines that you have been prescribed - Everybody reacts differently to medications and while MOST people have no trouble with most medicines, you may have a reaction such as nausea, vomiting, rash, swelling, shortness of breath. If this is the case, please stop taking the medicine immediately and contact your physician.  ?

## 2019-12-09 LAB — HIV ANTIBODY (ROUTINE TESTING W REFLEX): HIV Screen 4th Generation wRfx: NONREACTIVE

## 2019-12-09 LAB — RPR: RPR Ser Ql: NONREACTIVE

## 2019-12-10 LAB — GC/CHLAMYDIA PROBE AMP (~~LOC~~) NOT AT ARMC
Chlamydia: NEGATIVE
Neisseria Gonorrhea: NEGATIVE

## 2021-02-09 DIAGNOSIS — A749 Chlamydial infection, unspecified: Secondary | ICD-10-CM

## 2021-02-09 HISTORY — DX: Chlamydial infection, unspecified: A74.9

## 2021-07-04 ENCOUNTER — Encounter (HOSPITAL_BASED_OUTPATIENT_CLINIC_OR_DEPARTMENT_OTHER): Payer: Self-pay | Admitting: *Deleted

## 2021-07-04 ENCOUNTER — Other Ambulatory Visit: Payer: Self-pay

## 2021-07-04 DIAGNOSIS — Z20822 Contact with and (suspected) exposure to covid-19: Secondary | ICD-10-CM | POA: Diagnosis not present

## 2021-07-04 DIAGNOSIS — R1011 Right upper quadrant pain: Secondary | ICD-10-CM | POA: Diagnosis present

## 2021-07-04 DIAGNOSIS — K8012 Calculus of gallbladder with acute and chronic cholecystitis without obstruction: Secondary | ICD-10-CM | POA: Diagnosis not present

## 2021-07-04 NOTE — ED Triage Notes (Signed)
C/o mid abd pain x 30 mins , denies n/v/d

## 2021-07-05 ENCOUNTER — Encounter (HOSPITAL_COMMUNITY): Payer: Self-pay

## 2021-07-05 ENCOUNTER — Observation Stay (HOSPITAL_COMMUNITY): Payer: BC Managed Care – PPO | Admitting: Registered Nurse

## 2021-07-05 ENCOUNTER — Encounter (HOSPITAL_COMMUNITY)
Admission: EM | Disposition: A | Discharge status: Home / Self Care | Payer: Self-pay | Source: Home / Self Care | Attending: Emergency Medicine

## 2021-07-05 ENCOUNTER — Observation Stay (HOSPITAL_BASED_OUTPATIENT_CLINIC_OR_DEPARTMENT_OTHER)
Admission: EM | Admit: 2021-07-05 | Discharge: 2021-07-06 | Disposition: A | Discharge status: Home / Self Care | Payer: BC Managed Care – PPO | Attending: General Surgery | Admitting: General Surgery

## 2021-07-05 ENCOUNTER — Emergency Department (HOSPITAL_BASED_OUTPATIENT_CLINIC_OR_DEPARTMENT_OTHER): Payer: BC Managed Care – PPO

## 2021-07-05 DIAGNOSIS — R1011 Right upper quadrant pain: Secondary | ICD-10-CM

## 2021-07-05 DIAGNOSIS — R002 Palpitations: Secondary | ICD-10-CM | POA: Diagnosis present

## 2021-07-05 DIAGNOSIS — K819 Cholecystitis, unspecified: Secondary | ICD-10-CM

## 2021-07-05 DIAGNOSIS — K8012 Calculus of gallbladder with acute and chronic cholecystitis without obstruction: Secondary | ICD-10-CM | POA: Diagnosis not present

## 2021-07-05 DIAGNOSIS — R202 Paresthesia of skin: Secondary | ICD-10-CM | POA: Insufficient documentation

## 2021-07-05 DIAGNOSIS — Z8711 Personal history of peptic ulcer disease: Secondary | ICD-10-CM | POA: Insufficient documentation

## 2021-07-05 DIAGNOSIS — K810 Acute cholecystitis: Secondary | ICD-10-CM | POA: Diagnosis present

## 2021-07-05 DIAGNOSIS — Z8719 Personal history of other diseases of the digestive system: Secondary | ICD-10-CM | POA: Insufficient documentation

## 2021-07-05 DIAGNOSIS — R200 Anesthesia of skin: Secondary | ICD-10-CM | POA: Insufficient documentation

## 2021-07-05 DIAGNOSIS — T8332XA Displacement of intrauterine contraceptive device, initial encounter: Secondary | ICD-10-CM

## 2021-07-05 DIAGNOSIS — Z20822 Contact with and (suspected) exposure to covid-19: Secondary | ICD-10-CM | POA: Diagnosis not present

## 2021-07-05 HISTORY — PX: CHOLECYSTECTOMY: SHX55

## 2021-07-05 HISTORY — DX: Displacement of intrauterine contraceptive device, initial encounter: T83.32XA

## 2021-07-05 LAB — CBC
HCT: 38.3 % (ref 36.0–46.0)
Hemoglobin: 13.1 g/dL (ref 12.0–15.0)
MCH: 30.3 pg (ref 26.0–34.0)
MCHC: 34.2 g/dL (ref 30.0–36.0)
MCV: 88.7 fL (ref 80.0–100.0)
Platelets: 273 10*3/uL (ref 150–400)
RBC: 4.32 MIL/uL (ref 3.87–5.11)
RDW: 12.1 % (ref 11.5–15.5)
WBC: 9.5 10*3/uL (ref 4.0–10.5)
nRBC: 0 % (ref 0.0–0.2)

## 2021-07-05 LAB — RESP PANEL BY RT-PCR (FLU A&B, COVID) ARPGX2
Influenza A by PCR: NEGATIVE
Influenza B by PCR: NEGATIVE
SARS Coronavirus 2 by RT PCR: NEGATIVE

## 2021-07-05 LAB — PREGNANCY, URINE: Preg Test, Ur: NEGATIVE

## 2021-07-05 LAB — URINALYSIS, ROUTINE W REFLEX MICROSCOPIC
Bilirubin Urine: NEGATIVE
Glucose, UA: NEGATIVE mg/dL
Hgb urine dipstick: NEGATIVE
Ketones, ur: NEGATIVE mg/dL
Leukocytes,Ua: NEGATIVE
Nitrite: NEGATIVE
Protein, ur: NEGATIVE mg/dL
Specific Gravity, Urine: 1.015 (ref 1.005–1.030)
pH: 6.5 (ref 5.0–8.0)

## 2021-07-05 LAB — COMPREHENSIVE METABOLIC PANEL
ALT: 39 U/L (ref 0–44)
AST: 67 U/L — ABNORMAL HIGH (ref 15–41)
Albumin: 3.9 g/dL (ref 3.5–5.0)
Alkaline Phosphatase: 58 U/L (ref 38–126)
Anion gap: 7 (ref 5–15)
BUN: 18 mg/dL (ref 6–20)
CO2: 26 mmol/L (ref 22–32)
Calcium: 8.8 mg/dL — ABNORMAL LOW (ref 8.9–10.3)
Chloride: 105 mmol/L (ref 98–111)
Creatinine, Ser: 0.83 mg/dL (ref 0.44–1.00)
GFR, Estimated: >60 mL/min (ref >60)
Glucose, Bld: 84 mg/dL (ref 70–99)
Potassium: 3.7 mmol/L (ref 3.5–5.1)
Sodium: 138 mmol/L (ref 135–145)
Total Bilirubin: 0.5 mg/dL (ref 0.3–1.2)
Total Protein: 7.3 g/dL (ref 6.5–8.1)

## 2021-07-05 LAB — LIPASE, BLOOD: Lipase: 33 U/L (ref 11–51)

## 2021-07-05 LAB — SURGICAL PCR SCREEN
MRSA, PCR: NEGATIVE
Staphylococcus aureus: NEGATIVE

## 2021-07-05 SURGERY — LAPAROSCOPIC CHOLECYSTECTOMY
Anesthesia: General | Site: Abdomen

## 2021-07-05 MED ORDER — OXYCODONE HCL 5 MG/5ML PO SOLN
5.0000 mg | Freq: Once | ORAL | Status: DC | PRN
Start: 2021-07-05 — End: 2021-07-05

## 2021-07-05 MED ORDER — DIPHENHYDRAMINE HCL 50 MG/ML IJ SOLN: 12.5000 mg | Freq: Four times a day (QID) | INTRAMUSCULAR | Status: DC | PRN

## 2021-07-05 MED ORDER — 0.9 % SODIUM CHLORIDE (POUR BTL) OPTIME
TOPICAL | Status: DC | PRN
  Administered 2021-07-05: 1000 mL

## 2021-07-05 MED ORDER — ALUM & MAG HYDROXIDE-SIMETH 200-200-20 MG/5ML PO SUSP
30.0000 mL | Freq: Once | ORAL | Status: AC
  Administered 2021-07-05: 30 mL via ORAL
  Filled 2021-07-05: qty 30

## 2021-07-05 MED ORDER — SODIUM CHLORIDE 0.9 % IV SOLN: 2.0000 g | INTRAVENOUS | Status: DC

## 2021-07-05 MED ORDER — ACETAMINOPHEN 10 MG/ML IV SOLN: 1000.0000 mg | Freq: Once | INTRAVENOUS | Status: DC | PRN

## 2021-07-05 MED ORDER — DEXAMETHASONE SODIUM PHOSPHATE 10 MG/ML IJ SOLN
INTRAMUSCULAR | Status: AC
  Filled 2021-07-05: qty 1

## 2021-07-05 MED ORDER — SODIUM CHLORIDE 0.9 % IV BOLUS
1000.0000 mL | Freq: Once | INTRAVENOUS | Status: AC
  Administered 2021-07-05: 1000 mL via INTRAVENOUS

## 2021-07-05 MED ORDER — ENSURE PRE-SURGERY PO LIQD
296.0000 mL | Freq: Once | ORAL | Status: DC
  Filled 2021-07-05: qty 296

## 2021-07-05 MED ORDER — ENOXAPARIN SODIUM 40 MG/0.4ML IJ SOSY: 40.0000 mg | PREFILLED_SYRINGE | INTRAMUSCULAR | Status: DC

## 2021-07-05 MED ORDER — ONDANSETRON HCL 4 MG/2ML IJ SOLN: 4.0000 mg | Freq: Four times a day (QID) | INTRAMUSCULAR | Status: DC | PRN

## 2021-07-05 MED ORDER — HYDROMORPHONE HCL 1 MG/ML IJ SOLN: 0.5000 mg | INTRAMUSCULAR | Status: DC | PRN

## 2021-07-05 MED ORDER — ACETAMINOPHEN 160 MG/5ML PO SOLN: 325.0000 mg | ORAL | Status: DC | PRN

## 2021-07-05 MED ORDER — FENTANYL CITRATE (PF) 100 MCG/2ML IJ SOLN
INTRAMUSCULAR | Status: AC
  Filled 2021-07-05: qty 2

## 2021-07-05 MED ORDER — MORPHINE SULFATE (PF) 4 MG/ML IV SOLN
4.0000 mg | Freq: Once | INTRAVENOUS | Status: AC
Start: 2021-07-05 — End: 2021-07-05
  Administered 2021-07-05: 4 mg via INTRAVENOUS
  Filled 2021-07-05: qty 1

## 2021-07-05 MED ORDER — DEXAMETHASONE SODIUM PHOSPHATE 10 MG/ML IJ SOLN
INTRAMUSCULAR | Status: DC | PRN
  Administered 2021-07-05: 8 mg via INTRAVENOUS

## 2021-07-05 MED ORDER — ACETAMINOPHEN 325 MG PO TABS: 325.0000 mg | ORAL_TABLET | Freq: Four times a day (QID) | ORAL | Status: DC | PRN

## 2021-07-05 MED ORDER — ACETAMINOPHEN 500 MG PO TABS: 1000.0000 mg | ORAL_TABLET | ORAL | Status: AC

## 2021-07-05 MED ORDER — FENTANYL CITRATE (PF) 100 MCG/2ML IJ SOLN
INTRAMUSCULAR | Status: DC | PRN
  Administered 2021-07-05 (×4): 50 ug via INTRAVENOUS

## 2021-07-05 MED ORDER — MIDAZOLAM HCL 5 MG/5ML IJ SOLN
INTRAMUSCULAR | Status: DC | PRN
  Administered 2021-07-05: 2 mg via INTRAVENOUS

## 2021-07-05 MED ORDER — SODIUM CHLORIDE 0.9 % IV SOLN
8.0000 mg | Freq: Four times a day (QID) | INTRAVENOUS | Status: DC | PRN
  Filled 2021-07-05: qty 4

## 2021-07-05 MED ORDER — ACETAMINOPHEN 500 MG PO TABS
1000.0000 mg | ORAL_TABLET | Freq: Four times a day (QID) | ORAL | Status: DC
  Administered 2021-07-05 (×2): 1000 mg via ORAL
  Filled 2021-07-05 (×2): qty 2

## 2021-07-05 MED ORDER — CHLORHEXIDINE GLUCONATE 0.12 % MT SOLN
15.0000 mL | Freq: Once | OROMUCOSAL | Status: AC
  Administered 2021-07-05: 15 mL via OROMUCOSAL

## 2021-07-05 MED ORDER — OXYCODONE HCL 5 MG PO TABS: 5.0000 mg | ORAL_TABLET | Freq: Once | ORAL | Status: DC | PRN

## 2021-07-05 MED ORDER — METOPROLOL TARTRATE 12.5 MG HALF TABLET: 12.5000 mg | ORAL_TABLET | Freq: Two times a day (BID) | ORAL | Status: DC | PRN

## 2021-07-05 MED ORDER — MORPHINE SULFATE (PF) 4 MG/ML IV SOLN
4.0000 mg | Freq: Once | INTRAVENOUS | Status: AC
  Administered 2021-07-05: 4 mg via INTRAVENOUS
  Filled 2021-07-05: qty 1

## 2021-07-05 MED ORDER — LIDOCAINE HCL (PF) 2 % IJ SOLN
INTRAMUSCULAR | Status: AC
  Filled 2021-07-05: qty 5

## 2021-07-05 MED ORDER — CHLORHEXIDINE GLUCONATE CLOTH 2 % EX PADS: 6.0000 | MEDICATED_PAD | Freq: Once | CUTANEOUS | Status: DC

## 2021-07-05 MED ORDER — INDOCYANINE GREEN 25 MG IV SOLR
INTRAVENOUS | Status: DC | PRN
  Administered 2021-07-05: 5 mg via INTRAVENOUS

## 2021-07-05 MED ORDER — SIMETHICONE 40 MG/0.6ML PO SUSP
80.0000 mg | Freq: Four times a day (QID) | ORAL | Status: DC | PRN
  Filled 2021-07-05: qty 1.2

## 2021-07-05 MED ORDER — KETOROLAC TROMETHAMINE 30 MG/ML IJ SOLN
INTRAMUSCULAR | Status: DC | PRN
  Administered 2021-07-05: 30 mg via INTRAVENOUS

## 2021-07-05 MED ORDER — FENTANYL CITRATE PF 50 MCG/ML IJ SOSY
PREFILLED_SYRINGE | INTRAMUSCULAR | Status: AC
  Administered 2021-07-05: 25 ug via INTRAVENOUS
  Filled 2021-07-05: qty 1

## 2021-07-05 MED ORDER — HYDROMORPHONE HCL 1 MG/ML IJ SOLN
0.5000 mg | INTRAMUSCULAR | Status: DC | PRN
  Administered 2021-07-05: 1 mg via INTRAVENOUS
  Filled 2021-07-05: qty 1

## 2021-07-05 MED ORDER — GABAPENTIN 300 MG PO CAPS: 300.0000 mg | ORAL_CAPSULE | ORAL | Status: AC

## 2021-07-05 MED ORDER — METHOCARBAMOL 500 MG PO TABS: 1000.0000 mg | ORAL_TABLET | Freq: Four times a day (QID) | ORAL | Status: DC | PRN

## 2021-07-05 MED ORDER — ACETAMINOPHEN 325 MG PO TABS: 325.0000 mg | ORAL_TABLET | ORAL | Status: DC | PRN

## 2021-07-05 MED ORDER — PROMETHAZINE HCL 25 MG/ML IJ SOLN: 6.2500 mg | INTRAMUSCULAR | Status: DC | PRN

## 2021-07-05 MED ORDER — SODIUM CHLORIDE 0.9 % IV SOLN
2.0000 g | Freq: Once | INTRAVENOUS | Status: AC
  Administered 2021-07-05: 2 g via INTRAVENOUS
  Filled 2021-07-05: qty 20

## 2021-07-05 MED ORDER — ACETAMINOPHEN 500 MG PO TABS
ORAL_TABLET | ORAL | Status: AC
  Administered 2021-07-05: 1000 mg via ORAL
  Filled 2021-07-05: qty 2

## 2021-07-05 MED ORDER — ACETAMINOPHEN 325 MG PO TABS: 650.0000 mg | ORAL_TABLET | Freq: Four times a day (QID) | ORAL | Status: DC | PRN

## 2021-07-05 MED ORDER — DIPHENHYDRAMINE HCL 12.5 MG/5ML PO ELIX: 12.5000 mg | ORAL_SOLUTION | Freq: Four times a day (QID) | ORAL | Status: DC | PRN

## 2021-07-05 MED ORDER — AMISULPRIDE (ANTIEMETIC) 5 MG/2ML IV SOLN: 10.0000 mg | Freq: Once | INTRAVENOUS | Status: DC | PRN

## 2021-07-05 MED ORDER — ROCURONIUM BROMIDE 10 MG/ML (PF) SYRINGE
PREFILLED_SYRINGE | INTRAVENOUS | Status: AC
  Filled 2021-07-05: qty 10

## 2021-07-05 MED ORDER — PROCHLORPERAZINE EDISYLATE 10 MG/2ML IJ SOLN: 5.0000 mg | INTRAMUSCULAR | Status: DC | PRN

## 2021-07-05 MED ORDER — METHOCARBAMOL 1000 MG/10ML IJ SOLN: 1000.0000 mg | Freq: Four times a day (QID) | INTRAVENOUS | Status: DC | PRN

## 2021-07-05 MED ORDER — ALUM & MAG HYDROXIDE-SIMETH 200-200-20 MG/5ML PO SUSP: 30.0000 mL | Freq: Four times a day (QID) | ORAL | Status: DC | PRN

## 2021-07-05 MED ORDER — PROPOFOL 10 MG/ML IV BOLUS
INTRAVENOUS | Status: DC | PRN
  Administered 2021-07-05: 170 mg via INTRAVENOUS

## 2021-07-05 MED ORDER — LIDOCAINE 2% (20 MG/ML) 5 ML SYRINGE
INTRAMUSCULAR | Status: DC | PRN
  Administered 2021-07-05: 80 mg via INTRAVENOUS

## 2021-07-05 MED ORDER — METOPROLOL TARTRATE 5 MG/5ML IV SOLN: 5.0000 mg | Freq: Four times a day (QID) | INTRAVENOUS | Status: DC | PRN

## 2021-07-05 MED ORDER — ACETAMINOPHEN 500 MG PO TABS: 1000.0000 mg | ORAL_TABLET | Freq: Four times a day (QID) | ORAL | 0 refills | Status: DC | PRN

## 2021-07-05 MED ORDER — LACTATED RINGERS IV SOLN: INTRAVENOUS | Status: DC

## 2021-07-05 MED ORDER — MIDAZOLAM HCL 2 MG/2ML IJ SOLN
INTRAMUSCULAR | Status: AC
  Filled 2021-07-05: qty 2

## 2021-07-05 MED ORDER — OXYCODONE HCL 5 MG PO TABS: 5.0000 mg | ORAL_TABLET | ORAL | 0 refills | Status: DC | PRN

## 2021-07-05 MED ORDER — MENTHOL 3 MG MT LOZG: 1.0000 | LOZENGE | OROMUCOSAL | Status: DC | PRN

## 2021-07-05 MED ORDER — KETOROLAC TROMETHAMINE 30 MG/ML IJ SOLN
INTRAMUSCULAR | Status: AC
  Filled 2021-07-05: qty 1

## 2021-07-05 MED ORDER — PHENOL 1.4 % MT LIQD: 2.0000 | OROMUCOSAL | Status: DC | PRN

## 2021-07-05 MED ORDER — PANTOPRAZOLE SODIUM 20 MG PO TBEC
20.0000 mg | DELAYED_RELEASE_TABLET | Freq: Every day | ORAL | Status: DC
  Administered 2021-07-06: 20 mg via ORAL
  Filled 2021-07-05 (×2): qty 1

## 2021-07-05 MED ORDER — LACTATED RINGERS IV BOLUS: 1000.0000 mL | Freq: Three times a day (TID) | INTRAVENOUS | Status: DC | PRN

## 2021-07-05 MED ORDER — PROPOFOL 10 MG/ML IV BOLUS
INTRAVENOUS | Status: AC
  Filled 2021-07-05: qty 20

## 2021-07-05 MED ORDER — CYCLOBENZAPRINE HCL 10 MG PO TABS
10.0000 mg | ORAL_TABLET | Freq: Two times a day (BID) | ORAL | Status: DC | PRN
  Administered 2021-07-06: 10 mg via ORAL
  Filled 2021-07-05: qty 1

## 2021-07-05 MED ORDER — MAGIC MOUTHWASH
15.0000 mL | Freq: Four times a day (QID) | ORAL | Status: DC | PRN
Start: 2021-07-05 — End: 2021-07-05
  Filled 2021-07-05: qty 15

## 2021-07-05 MED ORDER — ROCURONIUM BROMIDE 10 MG/ML (PF) SYRINGE
PREFILLED_SYRINGE | INTRAVENOUS | Status: DC | PRN
  Administered 2021-07-05: 60 mg via INTRAVENOUS

## 2021-07-05 MED ORDER — SODIUM CHLORIDE 0.9 % IV SOLN: Freq: Three times a day (TID) | INTRAVENOUS | Status: DC | PRN

## 2021-07-05 MED ORDER — BUPIVACAINE LIPOSOME 1.3 % IJ SUSP: 20.0000 mL | Freq: Once | INTRAMUSCULAR | Status: DC

## 2021-07-05 MED ORDER — OXYCODONE HCL 5 MG PO TABS
5.0000 mg | ORAL_TABLET | ORAL | Status: DC | PRN
  Administered 2021-07-05 – 2021-07-06 (×4): 5 mg via ORAL
  Filled 2021-07-05 (×4): qty 1

## 2021-07-05 MED ORDER — ONDANSETRON HCL 4 MG/2ML IJ SOLN
INTRAMUSCULAR | Status: AC
  Filled 2021-07-05: qty 2

## 2021-07-05 MED ORDER — ACETAMINOPHEN 650 MG RE SUPP: 650.0000 mg | Freq: Four times a day (QID) | RECTAL | Status: DC | PRN

## 2021-07-05 MED ORDER — SUGAMMADEX SODIUM 200 MG/2ML IV SOLN
INTRAVENOUS | Status: DC | PRN
  Administered 2021-07-05: 200 mg via INTRAVENOUS

## 2021-07-05 MED ORDER — FENTANYL CITRATE PF 50 MCG/ML IJ SOSY
PREFILLED_SYRINGE | INTRAMUSCULAR | Status: AC
  Filled 2021-07-05: qty 1

## 2021-07-05 MED ORDER — LACTATED RINGERS IV SOLN
INTRAVENOUS | Status: AC | PRN
  Administered 2021-07-05: 1000 mL

## 2021-07-05 MED ORDER — GABAPENTIN 300 MG PO CAPS
ORAL_CAPSULE | ORAL | Status: AC
  Administered 2021-07-05: 300 mg via ORAL
  Filled 2021-07-05: qty 1

## 2021-07-05 MED ORDER — PROCHLORPERAZINE EDISYLATE 10 MG/2ML IJ SOLN: 5.0000 mg | Freq: Four times a day (QID) | INTRAMUSCULAR | Status: DC | PRN

## 2021-07-05 MED ORDER — PROCHLORPERAZINE MALEATE 10 MG PO TABS
10.0000 mg | ORAL_TABLET | Freq: Four times a day (QID) | ORAL | Status: DC | PRN
  Filled 2021-07-05: qty 1

## 2021-07-05 MED ORDER — LIP MEDEX EX OINT
1.0000 "application " | TOPICAL_OINTMENT | Freq: Two times a day (BID) | CUTANEOUS | Status: DC
  Administered 2021-07-05 – 2021-07-06 (×2): 1 via TOPICAL
  Filled 2021-07-05: qty 7

## 2021-07-05 MED ORDER — LACTATED RINGERS IV BOLUS: 1000.0000 mL | Freq: Once | INTRAVENOUS | Status: DC

## 2021-07-05 MED ORDER — METRONIDAZOLE 500 MG/100ML IV SOLN
500.0000 mg | Freq: Once | INTRAVENOUS | Status: AC
  Administered 2021-07-05: 500 mg via INTRAVENOUS
  Filled 2021-07-05: qty 100

## 2021-07-05 MED ORDER — ONDANSETRON HCL 4 MG/2ML IJ SOLN
INTRAMUSCULAR | Status: DC | PRN
  Administered 2021-07-05: 4 mg via INTRAVENOUS

## 2021-07-05 MED ORDER — FENTANYL CITRATE PF 50 MCG/ML IJ SOSY
25.0000 ug | PREFILLED_SYRINGE | INTRAMUSCULAR | Status: DC | PRN
  Administered 2021-07-05: 50 ug via INTRAVENOUS
  Administered 2021-07-05: 25 ug via INTRAVENOUS

## 2021-07-05 MED ORDER — BUPIVACAINE-EPINEPHRINE 0.25% -1:200000 IJ SOLN
INTRAMUSCULAR | Status: DC | PRN
  Administered 2021-07-05: 30 mL

## 2021-07-05 MED ORDER — BISACODYL 10 MG RE SUPP: 10.0000 mg | Freq: Two times a day (BID) | RECTAL | Status: DC | PRN

## 2021-07-05 SURGICAL SUPPLY — 50 items
APPLICATOR ARISTA FLEXITIP XL (MISCELLANEOUS) IMPLANT
APPLIER CLIP 5 13 M/L LIGAMAX5 (MISCELLANEOUS)
APPLIER CLIP ROT 10 11.4 M/L (STAPLE)
BAG COUNTER SPONGE SURGICOUNT (BAG) IMPLANT
BENZOIN TINCTURE PRP APPL 2/3 (GAUZE/BANDAGES/DRESSINGS) IMPLANT
BNDG ADH 1X3 SHEER STRL LF (GAUZE/BANDAGES/DRESSINGS) ×8 IMPLANT
CABLE HIGH FREQUENCY MONO STRZ (ELECTRODE) ×2 IMPLANT
CHLORAPREP W/TINT 26 (MISCELLANEOUS) ×2 IMPLANT
CLIP APPLIE 5 13 M/L LIGAMAX5 (MISCELLANEOUS) IMPLANT
CLIP APPLIE ROT 10 11.4 M/L (STAPLE) IMPLANT
CLIP LIGATING HEMO O LOK GREEN (MISCELLANEOUS) IMPLANT
COVER MAYO STAND STRL (DRAPES) IMPLANT
COVER SURGICAL LIGHT HANDLE (MISCELLANEOUS) ×2 IMPLANT
DECANTER SPIKE VIAL GLASS SM (MISCELLANEOUS) ×2 IMPLANT
DERMABOND ADVANCED (GAUZE/BANDAGES/DRESSINGS) ×1
DERMABOND ADVANCED .7 DNX12 (GAUZE/BANDAGES/DRESSINGS) ×1 IMPLANT
DRAPE C-ARM 42X120 X-RAY (DRAPES) IMPLANT
DRSG TEGADERM 2-3/8X2-3/4 SM (GAUZE/BANDAGES/DRESSINGS) IMPLANT
ELECT REM PT RETURN 15FT ADLT (MISCELLANEOUS) ×2 IMPLANT
GAUZE SPONGE 2X2 8PLY STRL LF (GAUZE/BANDAGES/DRESSINGS) IMPLANT
GLOVE SRG 8 PF TXTR STRL LF DI (GLOVE) ×1 IMPLANT
GLOVE SURG MICRO LTX SZ7.5 (GLOVE) ×2 IMPLANT
GLOVE SURG UNDER POLY LF SZ8 (GLOVE) ×1
GOWN STRL REUS W/TWL XL LVL3 (GOWN DISPOSABLE) ×6 IMPLANT
GRASPER SUT TROCAR 14GX15 (MISCELLANEOUS) IMPLANT
HEMOSTAT ARISTA ABSORB 3G PWDR (HEMOSTASIS) IMPLANT
HEMOSTAT SNOW SURGICEL 2X4 (HEMOSTASIS) IMPLANT
KIT BASIN OR (CUSTOM PROCEDURE TRAY) ×2 IMPLANT
KIT TURNOVER KIT A (KITS) ×2 IMPLANT
L-HOOK LAP DISP 36CM (ELECTROSURGICAL)
LHOOK LAP DISP 36CM (ELECTROSURGICAL) IMPLANT
POUCH RETRIEVAL ECOSAC 10 (ENDOMECHANICALS) ×1 IMPLANT
POUCH RETRIEVAL ECOSAC 10MM (ENDOMECHANICALS) ×1
SCISSORS LAP 5X35 DISP (ENDOMECHANICALS) ×2 IMPLANT
SET CHOLANGIOGRAPH MIX (MISCELLANEOUS) IMPLANT
SET IRRIG TUBING LAPAROSCOPIC (IRRIGATION / IRRIGATOR) ×2 IMPLANT
SET TUBE SMOKE EVAC HIGH FLOW (TUBING) ×2 IMPLANT
SLEEVE XCEL OPT CAN 5 100 (ENDOMECHANICALS) ×4 IMPLANT
SPONGE GAUZE 2X2 STER 10/PKG (GAUZE/BANDAGES/DRESSINGS)
STRIP CLOSURE SKIN 1/2X4 (GAUZE/BANDAGES/DRESSINGS) IMPLANT
SUT MNCRL AB 4-0 PS2 18 (SUTURE) ×2 IMPLANT
SUT VIC AB 0 UR5 27 (SUTURE) IMPLANT
SUT VICRYL 0 TIES 12 18 (SUTURE) IMPLANT
SUT VICRYL 0 UR6 27IN ABS (SUTURE) IMPLANT
TOWEL OR 17X26 10 PK STRL BLUE (TOWEL DISPOSABLE) ×2 IMPLANT
TOWEL OR NON WOVEN STRL DISP B (DISPOSABLE) ×2 IMPLANT
TRAY LAPAROSCOPIC (CUSTOM PROCEDURE TRAY) ×2 IMPLANT
TROCAR BLADELESS OPT 5 100 (ENDOMECHANICALS) ×2 IMPLANT
TROCAR XCEL BLUNT TIP 100MML (ENDOMECHANICALS) IMPLANT
TROCAR XCEL NON-BLD 11X100MML (ENDOMECHANICALS) ×2 IMPLANT

## 2021-07-05 NOTE — Op Note (Addendum)
Annel Zunker [id number] 11-10-1975 07/05/2021  Laparoscopic Cholecystectomy with ICG dye immunofluorescent procedure Note  Indications: This patient presents with symptomatic gallbladder disease and will undergo laparoscopic cholecystectomy.  Please see H&P for additional details.  Patient had ongoing pain in the emergency room despite no overt evidence of wall thickening or pericholecystic fluid on ultrasound.  Not able to get symptomatic relief with IV medication.  Pre-operative Diagnosis: Symptomatic cholelithiasis, possible early acute cholecystitis  Post-operative Diagnosis: Same  Surgeon: Gaynelle Adu MD FACS  Assistants: Barnetta Chapel PA-C  Anesthesia: General endotracheal anesthesia   Procedure Details  The patient was seen again in the Holding Room. The risks, benefits, complications, treatment options, and expected outcomes were discussed with the patient. The possibilities of reaction to medication, pulmonary aspiration, perforation of viscus, bleeding, recurrent infection, finding a normal gallbladder, the need for additional procedures, failure to diagnose a condition, the possible need to convert to an open procedure, and creating a complication requiring transfusion or operation were discussed with the patient. The likelihood of improving the patient's symptoms with return to their baseline status is good.  The patient and/or family concurred with the proposed plan, giving informed consent. The site of surgery properly noted. The patient was taken to Operating Room, identified as [name] and the procedure verified as Laparoscopic Cholecystectomy with possible Intraoperative Cholangiogram. A Time Out was held and the above information confirmed. Antibiotic prophylaxis was administered.  Patient received ICG dye injection preoperatively.  Prior to the induction of general anesthesia, antibiotic prophylaxis was administered. General endotracheal anesthesia was then administered and  tolerated well. After the induction, the abdomen was prepped with Chloraprep and draped in the sterile fashion. The patient was positioned in the supine position.  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  A small incision was made at Palmer's point just below the left subcostal margin.  Then using a 0 degree 5 mm laparoscope through a 5 mm trocar I was able to advance it through all layers of the abdominal wall and carefully entered the abdominal cavity.  Pneumoperitoneum was smoothly established up to a patient pressure of 15 mmHg without any change in patient vital signs.  Laparoscope was advanced to the abdominal cavity was surveilled.  There is no evidence of injury to surrounding structures.  Patient was placed in reverse Trendelenburg and rotated to the left.  A small supraumbilical incision was made and a 5 mm trocar was placed at that location.  The laparoscope was placed in that location.  I then confirmed that my optical entry site was going to be good for operating with my right hand and that was.  2 5 mm trochars were placed in the right abdomen under direct visualization.  The optical entry trocar in the left upper quadrant was exchanged to a 11 mm trocar under direct visualization  The liver did not appear overtly fatty.   The gallbladder was identified, the fundus grasped and retracted cephalad. Adhesions were lysed bluntly and with the electrocautery where indicated, taking care not to injure any adjacent organs or viscus. The infundibulum was grasped and retracted laterally, exposing the peritoneum overlying the triangle of Calot. This was then divided and exposed in a blunt fashion. A critical view of the cystic duct and cystic artery was obtained.  The cystic duct was clearly identified and bluntly dissected circumferentially.  ICG immunofluorescent was activated using the laparoscope.  This doubly  confirmed the location of my cystic duct and common bile duct.  We were able to visualize the dye in the gallbladder, the cystic duct and the confluence of the cystic duct and common bile duct.  The cystic duct was ligated with a clip distally.      The cystic duct was then ligated with clips and divided. The cystic artery which had been identified & dissected free was ligated with clips and divided as well.   The gallbladder was dissected from the liver bed in retrograde fashion with the electrocautery. The liver bed was irrigated and inspected. Hemostasis was achieved with the electrocautery. Copious irrigation was utilized and was repeatedly aspirated until clear. The gallbladder was removed and placed in an Ecco sac.  The gallbladder and Ecco sac were then removed through the left upper quadrant port site.   This port site was closed with 2 interrupted 0 Vicryl's using the PMI suture passer with laparoscopic guidance.  There was no air leak.  There was no persistent fascial defect.    We again inspected the right upper quadrant for hemostasis.  The left upper quadrant closure was inspected and there was no air leak and nothing trapped within the closure. Pneumoperitoneum was released as we removed the trocars.  4-0 Monocryl was used to close the skin.   Dermabond was applied. The patient was then extubated and brought to the recovery room in stable condition. Instrument, sponge, and needle counts were correct at closure and at the conclusion of the case.   Findings: Early acute Cholecystitis with Cholelithiasis +critical view  Estimated Blood Loss: Minimal         Drains: none         Specimens: Gallbladder           Complications: None; patient tolerated the procedure well.         Disposition: PACU - hemodynamically stable.         Condition: stable  Mary Sella. Andrey Campanile, MD, FACS General, Bariatric, & Minimally Invasive Surgery [facility], [place]

## 2021-07-05 NOTE — ED Provider Notes (Signed)
Patient transferred from [facility] for general surgery evaluation of acute cholecystitis.  Pt with recurrent RUQ pain.  On exam pt with moderate tenderness, positive murphy's.  Will provide additional meds.  General surgery notified of patient's ED arrival.     [place] Fossa, MD 07/05/21 [phone]

## 2021-07-05 NOTE — Progress Notes (Signed)
MD Cornett was paged to notify about the patient deciding to stay.

## 2021-07-05 NOTE — Anesthesia Postprocedure Evaluation (Signed)
Anesthesia Post Note  Patient: Elizabeth Rios  Procedure(s) Performed: LAPAROSCOPIC CHOLECYSTECTOMY (Abdomen)     Patient location during evaluation: PACU Anesthesia Type: General Level of consciousness: awake and alert Pain management: pain level controlled Vital Signs Assessment: post-procedure vital signs reviewed and stable Respiratory status: spontaneous breathing, nonlabored ventilation, respiratory function stable and patient connected to nasal cannula oxygen Cardiovascular status: blood pressure returned to baseline and stable Postop Assessment: no apparent nausea or vomiting Anesthetic complications: no   No notable events documented.  Last Vitals:  Vitals:   07/05/21 1445 07/05/21 1454  BP: (!) 135/96   Pulse: 69   Resp: 20   Temp:  36.5 C  SpO2: 96%     Last Pain:  Vitals:   07/05/21 1445  TempSrc:   PainSc: Asleep                 Shelton Silvas

## 2021-07-05 NOTE — Anesthesia Procedure Notes (Signed)

## 2021-07-05 NOTE — H&P (Signed)
Elizabeth Rios  [birth year] [id number]  CARE TEAM:  PCP: [facility]  Outpatient Care Team: Patient Care Team: [facility] as PCP - General  Inpatient Treatment Team: Treatment Team: Attending Provider: Tilden Fossa, MD; Consulting Physician: Montez Morita, Md, MD; Technician: Council Mechanic, NT; Registered Nurse: Weston Brass, RN   This patient is a 45 y.o.female who presents today for surgical evaluation at the request of Dr Pilar Plate, [facility] ED.   Chief complaint / Reason for evaluation: Abdominal pain.  Probable symptomatic cholecystitis  Pleasant morbidly obese female who had episode of abdominal pain that woke her up.  Not relieved with home interventions.  Concerning.  Came to emergency room.  Seemed central and vague but exam more concerning for right upper quadrant.  Ultrasound confirms gallbladder with stones and a sonographic Murphy sign.  No definite major inflammation yet.  Not able to get symptom relief with intravenous medications with persistent pain.  Surgical consultation requested.  Patient transferred to [facility] where surgery is available.  Patient denies much in the way of heartburn relieved reflux.  Did not get improvement with GI cocktail.No personal nor family history of GI/colon cancer, inflammatory bowel disease, irritable bowel syndrome, allergy such as Celiac Sprue, dietary/dairy problems, colitis, ulcers nor gastritis.  No recent sick contacts/gastroenteritis.  No travel outside the country.  No changes in diet.  No dysphagia to solids or liquids.  No significant heartburn or reflux.  No hematochezia, hematemesis, coffee ground emesis.  Question history of ulcer but nothing active right now.  2 C-sections.  No other surgeries.    Assessment  [name]  45 y.o. female       Problem List:  Principal Problem:   Acute cholecystitis Active Problems:   Heart palpitations   Right upper quadrant pain with nausea with  sonographic Murphy sign and persistent pain suspicious for cholecystitis  Plan:  IV antibiotics.  This is already started.  IV fluid resuscitation.  Nausea control.  Neuro pain control  Probable laparoscopic cholecystectomy later today depending on availability.  The anatomy & physiology of hepatobiliary & pancreatic function was discussed.  The pathophysiology of gallbladder dysfunction was discussed.  Natural history risks without surgery was discussed.   I feel the risks of no intervention will lead to serious problems that outweigh the operative risks; therefore, I recommended cholecystectomy to remove the pathology.  I explained laparoscopic techniques with possible need for an open approach.  Probable cholangiogram to evaluate the bilary tract was explained as well.    Risks such as bleeding, infection, diarrhea and other bowel changes, abscess, leak, injury to other organs, need for repair of tissues / organs, need for further treatment, stroke, heart attack, death, and other risks were discussed.  I noted a good likelihood this will help address the problem, but there is a chance it may not help.  Possibility that this will not correct all abdominal symptoms was explained.  Goals of post-operative recovery were discussed as well.  We will work to minimize complications.  An educational handout further explaining the pathology and treatment options was given as well.  Questions were answered.  The patient expresses understanding & wishes to proceed with surgery.   -VTE prophylaxis- SCDs, etc -mobilize as tolerated to help recovery  30 minutes spent in review, evaluation, examination, counseling, and coordination of care.  More than 50% of that time was spent in counseling.  Ardeth Sportsman, MD, FACS, MASCRS Esophageal, Gastrointestinal & Colorectal  Surgery Robotic and Minimally Invasive Surgery  [facility], [facility]  [facility]  [address] [place], [place] [address] [phone] Fax [phone] Main  CONTACT INFORMATION:  Weekday (9AM-5PM): Call [facility] main office at [phone]  Weeknight (5PM-9AM) or Weekend/Holiday: Check [email or web address] (password " [email or web address]") for General Surgery [facility] coverage  (Please, do not use SecureChat as it is not reliable communication to operating surgeons for immediate patient care)      07/05/2021      History reviewed. No pertinent past medical history.  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 [employer]    Employer: [employer]  Tobacco Use   Smoking status: Never   Smokeless tobacco: Never  Vaping Use   Vaping Use: Never used  Substance and Sexual Activity   Alcohol use: Not Currently   Drug use: No   Sexual activity: Not on file  Other Topics Concern   Not on file  Social History Narrative   Not on file   Social Determinants of Health   Financial Resource Strain: Not on file  Food Insecurity: Not on file  Transportation Needs: Not on file  Physical Activity: Not on file  Stress: Not on file  Social Connections: Not on file  Intimate Partner Violence: Not on file    Family History  Problem Relation Age of Onset   Heart attack Mother 58       x 4   Diabetes type II Mother    Heart attack Father 86       x 1   Diabetes Mellitus II Father    Heart attack Brother 83   Diabetes Mellitus II Brother        Ended up with Pnacreatitis & BLE PAD amputations - complicatios of DM>     Current Facility-Administered Medications  Medication Dose Route Frequency Provider Last Rate Last Admin   acetaminophen (TYLENOL) suppository 650 mg  650 mg Rectal  Q6H PRN Karie Soda, MD       acetaminophen (TYLENOL) tablet 1,000 mg  1,000 mg Oral On Call to OR Karie Soda, MD       acetaminophen (TYLENOL) tablet 325-650 mg  325-650 mg Oral Q6H PRN Karie Soda, MD       alum & mag hydroxide-simeth (MAALOX/MYLANTA) 200-200-20 MG/5ML suspension 30 mL  30 mL Oral Q6H PRN Karie Soda, MD       bupivacaine liposome (EXPAREL) 1.3 % injection 266 mg  20 mL Infiltration Once Karie Soda, MD       Chlorhexidine Gluconate Cloth 2 % PADS 6 each  6 each Topical Once Karie Soda, MD       And   Chlorhexidine Gluconate Cloth 2 % PADS 6 each  6 each Topical Once Zofia Peckinpaugh, Viviann Spare, MD       cyclobenzaprine (FLEXERIL) tablet 10 mg  10 mg Oral BID PRN Karie Soda, MD       diphenhydrAMINE (BENADRYL) injection 12.5-25 mg  12.5-25 mg Intravenous Q6H PRN Karie Soda, MD       [START ON 07/06/2021] feeding supplement (ENSURE PRE-SURGERY) liquid 296 mL  296  mL Oral Once Karie Soda, MD       gabapentin (NEURONTIN) capsule 300 mg  300 mg Oral On Call to OR Karie Soda, MD       HYDROmorphone (DILAUDID) injection 0.5-2 mg  0.5-2 mg Intravenous Q2H PRN Karie Soda, MD       lactated ringers bolus 1,000 mL  1,000 mL Intravenous Once Karie Soda, MD       lactated ringers bolus 1,000 mL  1,000 mL Intravenous Q8H PRN Karie Soda, MD       lactated ringers infusion   Intravenous Continuous Tilden Fossa, MD 75 mL/hr at 07/05/21 0554 New Bag at 07/05/21 0554   lip balm (CARMEX) ointment 1 application  1 application Topical BID Karie Soda, MD       magic mouthwash  15 mL Oral QID PRN Karie Soda, MD       menthol-cetylpyridinium (CEPACOL) lozenge 3 mg  1 lozenge Oral PRN Karie Soda, MD       methocarbamol (ROBAXIN) 1,000 mg in dextrose 5 % 100 mL IVPB  1,000 mg Intravenous Q6H PRN Karie Soda, MD       metoprolol tartrate (LOPRESSOR) injection 5 mg  5 mg Intravenous Q6H PRN Karie Soda, MD       metoprolol tartrate (LOPRESSOR) tablet 12.5 mg  12.5 mg  Oral Q12H PRN Karie Soda, MD       ondansetron [facility]) injection 4 mg  4 mg Intravenous Q6H PRN Karie Soda, MD       Or   ondansetron (ZOFRAN) 8 mg in sodium chloride 0.9 % 50 mL IVPB  8 mg Intravenous Q6H PRN Karie Soda, MD       oxyCODONE (Oxy IR/ROXICODONE) immediate release tablet 5-10 mg  5-10 mg Oral Q4H PRN Karie Soda, MD       pantoprazole (PROTONIX) EC tablet 20 mg  20 mg Oral Daily Karie Soda, MD       phenol (CHLORASEPTIC) mouth spray 2 spray  2 spray Mouth/Throat PRN Karie Soda, MD       prochlorperazine (COMPAZINE) injection 5-10 mg  5-10 mg Intravenous Q4H PRN Karie Soda, MD       simethicone (MYLICON) 40 MG/0.6ML suspension 80 mg  80 mg Oral QID PRN Karie Soda, MD       Current Outpatient Medications  Medication Sig Dispense Refill   cyclobenzaprine (FLEXERIL) 10 MG tablet Take 1 tablet (10 mg total) by mouth 2 (two) times daily as needed for muscle spasms. 10 tablet 0   ibuprofen (ADVIL,MOTRIN) 600 MG tablet Take 1 tablet (600 mg total) by mouth every 6 (six) hours as needed. 30 tablet 0   metroNIDAZOLE (FLAGYL) 500 MG tablet Take 1 tablet (500 mg total) by mouth 2 (two) times daily. 14 tablet 0   pantoprazole (PROTONIX) 20 MG tablet Take 1 tablet (20 mg total) by mouth daily. 30 tablet 0   traMADol (ULTRAM) 50 MG tablet 1-2 every 6 hours as needed 15 tablet 0     No Known Allergies ROS:   All other systems reviewed & are negative except per HPI or as noted below: Constitutional:  No fevers, chills, sweats.  Weight stable Eyes:  No vision changes, No discharge HENT:  No sore throats, nasal drainage Lymph: No neck swelling, No bruising easily Pulmonary:  No cough, productive sputum CV: No orthopnea, PND  Patient walks 20 minutes without difficulty.  No exertional chest/neck/shoulder/arm pain. GI:  No personal nor family history of GI/colon cancer, inflammatory bowel disease,  irritable bowel syndrome, allergy such as Celiac Sprue, dietary/dairy  problems, colitis, ?ulcers in past nor gastritis.  No recent sick contacts/gastroenteritis.  No travel outside the country.  No changes in diet. Renal: No UTIs, No hematuria Genital:  No drainage, bleeding, masses Musculoskeletal: No severe joint pain.  Good ROM major joints Skin:  No sores or lesions.  No rashes Heme/Lymph:  No easy bleeding.  No swollen lymph nodes Neuro: No focal weakness/numbness.  No seizures Psych: No suicidal ideation.  No hallucinations  BP (!) 146/94   Pulse 62   Temp (!) 97.5 F (36.4 C) (Oral)   Resp 18   Ht  (1.575 m)   Wt 99.8 kg   LMP  (LMP Unknown)   SpO2 100%   BMI 40.24 kg/m   Physical Exam:  Constitutional: Not cachectic.  Hygeine adequate.  Vitals signs as above.   Eyes: Pupils reactive, normal extraocular movements. Sclera nonicteric Neuro: CN II-XII intact.  No major focal sensory defects.  No major motor deficits. Lymph: No head/neck/groin lymphadenopathy Psych:  No severe agitation.  No severe anxiety.  Judgment & insight Adequate, Oriented x4, HENT: Normocephalic, Mucus membranes moist.  No thrush.   Neck: Supple, No tracheal deviation.  No obvious thyromegaly Chest: No pain to chest wall compression.  Good respiratory excursion.  No audible wheezing CV:  Pulses intact.   Regular rhythm.  No major extremity edema  Abdomen:  Obese with panniculus Hernia: Not present. Diastasis recti: Not present. Soft.   Nondistended.  Tenderness at RUQ w mild Murphy sign .  No hepatomegaly.  No splenomegaly  Gen:  Inguinal hernia: Not present.  Inguinal lymph nodes: without lymphadenopathy.    Rectal: (Deferred)  Ext: No obvious deformity or contracture.  Edema: Not present.  No cyanosis Skin: No major subcutaneous nodules.  Warm and dry Musculoskeletal: Severe joint rigidity not present.  No obvious clubbing.  No digital petechiae.     Results:   Labs: Results for orders placed or performed during the hospital encounter of 07/05/21 (from  the past 48 hour(s))  Urinalysis, Routine w reflex microscopic Urine, Clean Catch     Status: None   Collection Time: 07/04/21 11:54 PM  Result Value Ref Range   Color, Urine YELLOW YELLOW   APPearance CLEAR CLEAR   Specific Gravity, Urine 1.015 1.005 - 1.030   pH 6.5 5.0 - 8.0   Glucose, UA NEGATIVE NEGATIVE mg/dL   Hgb urine dipstick NEGATIVE NEGATIVE   Bilirubin Urine NEGATIVE NEGATIVE   Ketones, ur NEGATIVE NEGATIVE mg/dL   Protein, ur NEGATIVE NEGATIVE mg/dL   Nitrite NEGATIVE NEGATIVE   Leukocytes,Ua NEGATIVE NEGATIVE    Comment: Microscopic not done on urines with negative protein, blood, leukocytes, nitrite, or glucose < 500 mg/dL. Performed at [facility], [address] [place], [place] [address]   Pregnancy, urine     Status: None   Collection Time: 07/04/21 11:54 PM  Result Value Ref Range   Preg Test, Ur NEGATIVE NEGATIVE    Comment:        THE SENSITIVITY OF THIS METHODOLOGY IS >20 mIU/mL. Performed at [facility], [address] [place], [place] [address]   Lipase, blood     Status: None   Collection Time: 07/05/21 12:05 AM  Result Value Ref Range   Lipase 33 11 - 51 U/L    Comment: Performed at [facility], [address], [place], [place] [address]  Comprehensive  metabolic panel     Status: Abnormal   Collection Time: 07/05/21 12:05 AM  Result Value Ref Range   Sodium 138 135 - 145 mmol/L   Potassium 3.7 3.5 - 5.1 mmol/L   Chloride 105 98 - 111 mmol/L   CO2 26 22 - 32 mmol/L   Glucose, Bld 84 70 - 99 mg/dL    Comment: Glucose reference range applies only to samples taken after fasting for at least 8 hours.   BUN 18 6 - 20 mg/dL   Creatinine, Ser 6.60 0.44 - 1.00 mg/dL   Calcium 8.8 (L) 8.9 - 10.3 mg/dL   Total Protein 7.3 6.5 - 8.1 g/dL   Albumin 3.9 3.5 - 5.0 g/dL   AST 67 (H) 15 - 41 U/L   ALT 39 0 - 44 U/L   Alkaline Phosphatase 58 38 - 126 U/L   Total Bilirubin 0.5 0.3 - 1.2 mg/dL   GFR, Estimated >63 >01  mL/min    Comment: (NOTE) Calculated using the CKD-EPI Creatinine Equation (2021)    Anion gap 7 5 - 15    Comment: Performed at [facility], [address] [place], [place] [address]  CBC     Status: None   Collection Time: 07/05/21 12:05 AM  Result Value Ref Range   WBC 9.5 4.0 - 10.5 K/uL   RBC 4.32 3.87 - 5.11 MIL/uL   Hemoglobin 13.1 12.0 - 15.0 g/dL   HCT 32.3 55.7 - 32.2 %   MCV 88.7 80.0 - 100.0 fL   MCH 30.3 26.0 - 34.0 pg   MCHC 34.2 30.0 - 36.0 g/dL   RDW 02.5 42.7 - 06.2 %   Platelets 273 150 - 400 K/uL   nRBC 0.0 0.0 - 0.2 %    Comment: Performed at [facility], [address] [place], [place] [address]  Resp Panel by RT-PCR (Flu A&B, Covid) Nasopharyngeal Swab     Status: None   Collection Time: 07/05/21  3:34 AM   Specimen: Nasopharyngeal Swab; Nasopharyngeal(NP) swabs in vial transport medium  Result Value Ref Range   SARS Coronavirus 2 by RT PCR NEGATIVE NEGATIVE    Comment: (NOTE) SARS-CoV-2 target nucleic acids are NOT DETECTED.  The SARS-CoV-2 RNA is generally detectable in upper respiratory specimens during the acute phase of infection. The lowest concentration of SARS-CoV-2 viral copies this assay can detect is 138 copies/mL. A negative result does not preclude SARS-Cov-2 infection and should not be used as the sole basis for treatment or other patient management decisions. A negative result may occur with  improper specimen collection/handling, submission of specimen other than nasopharyngeal swab, presence of viral mutation(s) within the areas targeted by this assay, and inadequate number of viral copies(<138 copies/mL). A negative result must be combined with clinical observations, patient history, and epidemiological information. The expected result is Negative.  Fact Sheet for Patients:  BloggerCourse.com  Fact Sheet for Healthcare Providers:  SeriousBroker.it  This  test is no t yet approved or cleared by the [place] FDA and  has been authorized for detection and/or diagnosis of SARS-CoV-2 by FDA under an Emergency Use Authorization (EUA). This EUA will remain  in effect (meaning this test can be used) for the duration of the COVID-19 declaration under Section 564(b)(1) of the Act, 21 U.S.C.section 360bbb-3(b)(1), unless the authorization is terminated  or revoked sooner.       Influenza A by PCR NEGATIVE NEGATIVE   Influenza B by PCR  NEGATIVE NEGATIVE    Comment: (NOTE) The Xpert Xpress SARS-CoV-2/FLU/RSV plus assay is intended as an aid in the diagnosis of influenza from Nasopharyngeal swab specimens and should not be used as a sole basis for treatment. Nasal washings and aspirates are unacceptable for Xpert Xpress SARS-CoV-2/FLU/RSV testing.  Fact Sheet for Patients: BloggerCourse.com  Fact Sheet for Healthcare Providers: SeriousBroker.it  This test is not yet approved or cleared by the [place] FDA and has been authorized for detection and/or diagnosis of SARS-CoV-2 by FDA under an Emergency Use Authorization (EUA). This EUA will remain in effect (meaning this test can be used) for the duration of the COVID-19 declaration under Section 564(b)(1) of the Act, 21 U.S.C. section 360bbb-3(b)(1), unless the authorization is terminated or revoked.  Performed at [facility], [address] [place], [place] [address]     Imaging / Studies: US Abdomen Limited RUQ (LIVER/GB)  Result Date: 07/05/2021 CLINICAL DATA:  Right upper quadrant pain. EXAM: ULTRASOUND ABDOMEN LIMITED RIGHT UPPER QUADRANT COMPARISON:  None. FINDINGS: Gallbladder: Multiple shadowing echogenic gallstones are seen within the gallbladder lumen. The largest measures approximately 1.3 cm. There is no evidence of gallbladder wall thickening (2.4 mm). A positive sonographic Murphy's sign is noted by  sonographer. Common bile duct: Diameter: 2.4 mm Liver: No focal lesion identified. Diffusely increased echogenicity of the liver parenchyma is noted. Portal vein is patent on color Doppler imaging with normal direction of blood flow towards the liver. Other: None. IMPRESSION: 1. Cholelithiasis in the setting of a positive sonographic Murphy's sign which is suggestive of acute cholecystitis. 2. Fatty liver. Electronically Signed   By: Aram Candela M.D.   On: 07/05/2021 02:31    Medications / Allergies: per chart  Antibiotics: Anti-infectives (From admission, onward)    Start     Dose/Rate Route Frequency Ordered Stop   07/05/21 0300  cefTRIAXone (ROCEPHIN) 2 g in sodium chloride 0.9 % 100 mL IVPB        2 g 200 mL/hr over 30 Minutes Intravenous  Once 07/05/21 0257 07/05/21 0345   07/05/21 0300  metroNIDAZOLE (FLAGYL) IVPB 500 mg        500 mg 100 mL/hr over 60 Minutes Intravenous  Once 07/05/21 0257 07/05/21 0510         Note: Portions of this report may have been transcribed using voice recognition software. Every effort was made to ensure accuracy; however, inadvertent computerized transcription errors may be present.   Any transcriptional errors that result from this process are unintentional.    Ardeth Sportsman, MD, FACS, MASCRS Esophageal, Gastrointestinal & Colorectal Surgery Robotic and Minimally Invasive Surgery  [facility], [facility]  [facility]  [address] [place], [place] [address] [phone] Fax [phone] Main  CONTACT INFORMATION:  Weekday (9AM-5PM): Call [facility] main office at [phone]  Weeknight (5PM-9AM) or Weekend/Holiday: Check [email or web address] (password " [email or web address]") for General Surgery [facility] coverage  (Please, do not use SecureChat as it is not reliable communication to operating surgeons for immediate patient care)        07/05/2021  6:45 AM

## 2021-07-05 NOTE — Anesthesia Preprocedure Evaluation (Addendum)
Anesthesia Evaluation  Patient identified by MRN, date of birth, ID band Patient awake    Reviewed: Allergy & Precautions, NPO status , Patient's Chart, lab work & pertinent test results  Airway Mallampati: III  TM Distance: >3 FB Neck ROM: Full    Dental  (+) Teeth Intact, Dental Advisory Given   Pulmonary neg pulmonary ROS,    breath sounds clear to auscultation       Cardiovascular negative cardio ROS   Rhythm:Regular Rate:Normal     Neuro/Psych negative neurological ROS  negative psych ROS   GI/Hepatic Neg liver ROS, GERD  Medicated,  Endo/Other  negative endocrine ROS  Renal/GU negative Renal ROS     Musculoskeletal negative musculoskeletal ROS (+)   Abdominal (+) + obese,   Peds  Hematology negative hematology ROS (+)   Anesthesia Other Findings   Reproductive/Obstetrics                            Anesthesia Physical Anesthesia Plan  ASA: 3  Anesthesia Plan: General   Post-op Pain Management:    Induction: Intravenous  PONV Risk Score and Plan: 4 or greater and Ondansetron, Dexamethasone, Midazolam and Scopolamine patch - Pre-op  Airway Management Planned: Oral ETT  Additional Equipment: None  Intra-op Plan:   Post-operative Plan: Extubation in OR  Informed Consent: I have reviewed the patients History and Physical, chart, labs and discussed the procedure including the risks, benefits and alternatives for the proposed anesthesia with the patient or authorized representative who has indicated his/her understanding and acceptance.     Dental advisory given  Plan Discussed with: CRNA  Anesthesia Plan Comments:        Anesthesia Quick Evaluation

## 2021-07-05 NOTE — Discharge Summary (Signed)
Patient ID: Elizabeth Rios [id number] 1975-10-25 45 y.o.  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  Not relieved with home interventions.  Concerning.  Came to emergency room.  Seemed central and vague but exam more concerning for right upper quadrant.  Ultrasound confirms gallbladder with stones and a sonographic Murphy sign.  No definite major inflammation yet.  Not able to get symptom relief with intravenous medications with persistent pain.  Surgical consultation requested.  Patient transferred to [facility] where surgery is available.  Patient denies much in the way of heartburn relieved reflux.  Did not get improvement with GI cocktail.No personal nor family history of GI/colon cancer, inflammatory bowel disease, irritable bowel syndrome, allergy such as Celiac Sprue, dietary/dairy problems, colitis, ulcers nor gastritis.  No recent sick contacts/gastroenteritis.  No travel outside the country.  No changes in diet.  No dysphagia to solids or liquids.  No significant heartburn or reflux.  No hematochezia, hematemesis, coffee ground emesis.  Question history of ulcer but nothing active right now.  2 C-sections.  No other surgeries.  Procedures Lap chole with ICG dye, Dr. Andrey Campanile, 07/05/21  Hospital Course:  The patient was admitted and underwent a laparoscopic cholecystectomy with IOC.  The  patient tolerated the procedure well.  On POD 0, the patient was tolerating a regular diet, voiding well, mobilizing, and pain was controlled with oral pain medications.  The patient was stable for DC home at this time with appropriate follow up made.   Allergies as of 07/05/2021   No Known Allergies      Medication List     STOP taking these medications    pantoprazole 20 MG tablet Commonly known as: PROTONIX       TAKE these medications    acetaminophen 500 MG tablet Commonly known as: TYLENOL Take 2 tablets (1,000 mg total) by mouth every 6 (six) hours as needed.   cyclobenzaprine 10 MG tablet Commonly known as: FLEXERIL Take 1 tablet (10 mg total) by mouth 2 (two) times daily as needed for muscle spasms.   ibuprofen 200 MG tablet Commonly known as: ADVIL Take 400 mg by mouth every 6 (six) hours as needed for mild pain, headache or cramping.   oxyCODONE 5 MG immediate release tablet Commonly known as: Oxy IR/ROXICODONE Take 1 tablet (5 mg total) by mouth every 4 (four) hours as needed.   Vitamin D (Ergocalciferol) 1.25 MG (50000 UNIT) Caps capsule Commonly known as: DRISDOL Take 50,000 Units by mouth once a week. Friday          Follow-up Information     [facility], [facility] Follow up in 3 week(s).   Specialty: General Surgery Why: Please arrive 30 minutes prior to your appointment time for paperwork and check in process.  Our office is working on scheduling your appointment.  please call to confirm you appointment if you have not received a call in a  couple of days. Contact information: [address] [place] [address] [phone]                 Signed: Barnetta Chapel, [facility] Surgery 07/05/2021, 4:01 PM Please see Amion for pager number during day hours 7:00am-4:30pm, 7-11:30am on Weekends

## 2021-07-05 NOTE — ED Provider Notes (Signed)
[facility]PT [facility] Emergency Department Provider Note MRN:  [id number]  Arrival date & time: 07/05/21     Chief Complaint   Abdominal Pain   History of Present Illness   Elizabeth Rios is a 45 y.o. year-old female with no pertinent past medical presenting to the ED with chief complaint of abdominal pain.  Location: Epigastric Duration: A few hours Onset: Gradual Timing: Constant Description: Sharp Severity: Mild to moderate Exacerbating/Alleviating Factors: None Associated Symptoms: None Pertinent Negatives: No nausea vomiting, no fever, no chest pain or shortness of breath, no lower abdominal pain, no dysuria or hematuria  Additional History: Noticed the pain while trying to sleep tonight  Review of Systems  A complete 10 system review of systems was obtained and all systems are negative except as noted in the HPI and PMH.   Patient's Health History   History reviewed. No pertinent past medical history.  Past Surgical History:  Procedure Laterality Date   CESAREAN SECTION      Family History  Problem Relation Age of Onset   Heart attack Mother 49       x 4   Diabetes type II Mother    Heart attack Father 3       x 1   Diabetes Mellitus II Father    Heart attack Brother 75   Diabetes Mellitus II Brother        Ended up with Pnacreatitis & BLE PAD amputations - complicatios of DM>     Social History   Socioeconomic History   Marital status: Divorced    Spouse name: Not on file   Number of children: 3   Years of education: Not on file   Highest education level: High school graduate  Occupational History   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 [employer]    Employer: [employer]  Tobacco Use   Smoking status: Never   Smokeless tobacco: Never  Vaping Use   Vaping Use: Never used  Substance and Sexual Activity   Alcohol use: Not Currently   Drug use: No   Sexual activity: Not on file  Other Topics Concern   Not on file  Social  History Narrative   Not on file   Social Determinants of Health   Financial Resource Strain: Not on file  Food Insecurity: Not on file  Transportation Needs: Not on file  Physical Activity: Not on file  Stress: Not on file  Social Connections: Not on file  Intimate Partner Violence: Not on file     Physical Exam   Vitals:   07/04/21 2345 07/05/21 0300  BP: 122/66 (!) 138/98  Pulse: 85 92  Resp: 20 14  Temp: 98.1 F (36.7 C)   SpO2: 100% 100%    CONSTITUTIONAL: Well-appearing, NAD NEURO:  Alert and oriented x 3, no focal deficits EYES:  eyes equal and reactive ENT/NECK:  no LAD, no JVD CARDIO: Regular rate, well-perfused, normal S1 and S2 PULM:  CTAB no wheezing or rhonchi GI/GU:  normal bowel sounds, non-distended, non-tender MSK/SPINE:  No gross deformities, no edema SKIN:  no rash, atraumatic PSYCH:  Appropriate speech and behavior  *Additional and/or pertinent findings included in MDM below  Diagnostic and Interventional Summary    EKG Interpretation  Date/Time:    Ventricular Rate:    PR Interval:    QRS Duration:   QT Interval:    QTC Calculation:   R Axis:     Text Interpretation:  Labs Reviewed  COMPREHENSIVE METABOLIC PANEL - Abnormal; Notable for the following components:      Result Value   Calcium 8.8 (*)    AST 67 (*)    All other components within normal limits  RESP PANEL BY RT-PCR (FLU A&B, COVID) ARPGX2  LIPASE, BLOOD  CBC  URINALYSIS, ROUTINE W REFLEX MICROSCOPIC  PREGNANCY, URINE    US Abdomen Limited RUQ (LIVER/GB)  Final Result      Medications  cefTRIAXone (ROCEPHIN) 2 g in sodium chloride 0.9 % 100 mL IVPB (2 g Intravenous Started During Downtime 07/05/21 0317)  metroNIDAZOLE (FLAGYL) IVPB 500 mg (has no administration in time range)  alum & mag hydroxide-simeth (MAALOX/MYLANTA) 200-200-20 MG/5ML suspension 30 mL (30 mLs Oral Given 07/05/21 0137)  sodium chloride 0.9 % bolus 1,000 mL (1,000 mLs Intravenous New  Bag/Given 07/05/21 0309)  morphine 4 MG/ML injection 4 mg (4 mg Intravenous Given 07/05/21 0310)     Procedures  /  Critical Care .Critical Care Performed by: Sabas Sous, MD Authorized by: Sabas Sous, MD   Critical care provider statement:    Critical care time (minutes):  36   Critical care was necessary to treat or prevent imminent or life-threatening deterioration of the following conditions: Acute cholecystitis.   Critical care was time spent personally by me on the following activities:  Discussions with consultants, evaluation of patient's response to treatment, examination of patient, ordering and performing treatments and interventions, ordering and review of laboratory studies, ordering and review of radiographic studies, pulse oximetry, re-evaluation of patient's condition, obtaining history from patient or surrogate and review of old charts  ED Course and Medical Decision Making  I have reviewed the triage vital signs, the nursing notes, and pertinent available records from the EMR.  Listed above are laboratory and imaging tests that I personally ordered, reviewed, and interpreted and then considered in my medical decision making (see below for details).  Favoring gastritis/GERD versus biliary colic, will trial Maalox and obtain right upper quadrant ultrasound.  Overall a benign abdomen and normal vital signs and reassuring labs, anticipating discharge     Ultrasound reveals cholelithiasis with positive sonographic Murphy sign suspicious for cholecystitis.  There is no pericholecystic fluid, and patient has no white count, no fever.  On reassessment her pain is mostly epigastric but she does have some right upper quadrant tenderness.  Case discussed with Dr. Michaell Cowing of general surgery, given that her pain is continuing despite GI cocktail and still quite significant, most likely this is cholecystitis.  Plan is to transfer to [facility] emergency department for surgery to  evaluate and likely bring to the OR later today.  Dr. Madilyn Hook is the accepting ED physician at [facility]  Elmer Sow. Pilar Plate, MD [facility] Emergency Medicine [facility] [email or web address]  Final Clinical Impressions(s) / ED Diagnoses     ICD-10-CM   1. Cholecystitis  K81.9     2. RUQ abdominal pain  R10.11 US Abdomen Limited RUQ (LIVER/GB)    US Abdomen Limited RUQ (LIVER/GB)      ED Discharge Orders     None        Discharge Instructions Discussed with and Provided to Patient:   Discharge Instructions   None       Sabas Sous, MD 07/05/21 [phone]

## 2021-07-05 NOTE — Discharge Instructions (Addendum)
[facility]  Please arrive at least 30 min before your appointment to complete your check in paperwork.  If you are unable to arrive 30 min prior to your appointment time we may have to cancel or reschedule you. LAPAROSCOPIC SURGERY: POST OP INSTRUCTIONS Always review your discharge instruction sheet given to you by the facility where your surgery was performed. IF YOU HAVE DISABILITY OR FAMILY LEAVE FORMS, YOU MUST BRING THEM TO THE OFFICE FOR PROCESSING.   DO NOT GIVE THEM TO YOUR DOCTOR.  PAIN CONTROL  First take acetaminophen (Tylenol) AND/or ibuprofen (Advil) to control your pain after surgery.  Follow directions on package.  Taking acetaminophen (Tylenol) and/or ibuprofen (Advil) regularly after surgery will help to control your pain and lower the amount of prescription pain medication you may need.  You should not take more than 4,000 mg (4 grams) of acetaminophen (Tylenol) in 24 hours.  You should not take ibuprofen (Advil), aleve, motrin, naprosyn or other NSAIDS if you have a history of stomach ulcers or chronic kidney disease.  A prescription for pain medication may be given to you upon discharge.  Take your pain medication as prescribed, if you still have uncontrolled pain after taking acetaminophen (Tylenol) or ibuprofen (Advil). Use ice packs to help control pain. If you need a refill on your pain medication, please contact your pharmacy.  They will contact our office to request authorization. Prescriptions will not be filled after 5pm or on week-ends.  HOME MEDICATIONS Take your usually prescribed medications unless otherwise directed.  DIET You should follow a light diet the first few days after arrival home.  Be sure to include lots of fluids daily. Avoid fatty, fried foods.   CONSTIPATION It is common to experience some constipation after surgery and if you are taking pain medication.  Increasing fluid intake and taking a stool softener (such as Colace)  will usually help or prevent this problem from occurring.  A mild laxative (Milk of Magnesia or Miralax) should be taken according to package instructions if there are no bowel movements after 48 hours.  WOUND/INCISION CARE Most patients will experience some swelling and bruising in the area of the incisions.  Ice packs will help.  Swelling and bruising can take several days to resolve.  Unless discharge instructions indicate otherwise, follow guidelines below  STERI-STRIPS - you may remove your outer bandages 48 hours after surgery, and you may shower at that time.  You have steri-strips (small skin tapes) in place directly over the incision.  These strips should be left on the skin for 7-10 days.   DERMABOND/SKIN GLUE - you may shower in 24 hours.  The glue will flake off over the next 2-3 weeks. Any sutures or staples will be removed at the office during your follow-up visit.  ACTIVITIES You may resume regular (light) daily activities beginning the next day--such as daily self-care, walking, climbing stairs--gradually increasing activities as tolerated.  You may have sexual intercourse when it is comfortable.  Refrain from any heavy lifting or straining until approved by your doctor. You may drive when you are no longer taking prescription pain medication, you can comfortably wear a seatbelt, and you can safely maneuver your car and apply brakes.  FOLLOW-UP You should see your doctor in the office for a follow-up appointment approximately 2-3 weeks after your surgery.  You should have been given your post-op/follow-up appointment when your surgery was scheduled.  If you did not receive a post-op/follow-up appointment, make sure   that you call for this appointment within a day or two after you arrive home to insure a convenient appointment time. ° ° °WHEN TO CALL YOUR DOCTOR: °Fever over 101.0 °Inability to urinate °Continued bleeding from incision. °Increased pain, redness, or drainage from the  incision. °Increasing abdominal pain ° °The clinic staff is available to answer your questions during regular business hours.  Please don’t hesitate to call and ask to speak to one of the nurses for clinical concerns.  If you have a medical emergency, go to the nearest emergency room or call 911.  A surgeon from [facility] is always on call at the hospital. °[address], [address], [place], [place]  [address] ? [address] [address], [place], [place]   [address] °[phone] ? [phone] ? FAX [phone] ° ° ° ° °Managing Your Pain After Surgery Without Opioids ° ° ° °Thank you for participating in our program to help patients manage their pain after surgery without opioids. This is part of our effort to provide you with the best care possible, without exposing you or your family to the risk that opioids pose. ° °What pain can I expect after surgery? °You can expect to have some pain after surgery. This is normal. The pain is typically worse the day after surgery, and quickly begins to get better. °Many studies have found that many patients are able to manage their pain after surgery with Over-the-Counter (OTC) medications such as Tylenol and Motrin. If you have a condition that does not allow you to take Tylenol or Motrin, notify your surgical team. ° °How will I manage my pain? °The best strategy for controlling your pain after surgery is around the clock pain control with Tylenol (acetaminophen) and Motrin (ibuprofen or Advil). Alternating these medications with each other allows you to maximize your pain control. In addition to Tylenol and Motrin, you can use heating pads or ice packs on your incisions to help reduce your pain. ° °How will I alternate your regular strength over-the-counter pain medication? °You will take a dose of pain medication every three hours. °Start by taking 650 mg of Tylenol (2 pills of 325 mg) °3 hours later take 600 mg of Motrin (3 pills of 200 mg) °3 hours after  taking the Motrin take 650 mg of Tylenol °3 hours after that take 600 mg of Motrin. ° ° °- 1 - ° °See example - if your first dose of Tylenol is at 12:00 PM ° ° °12:00 PM Tylenol 650 mg (2 pills of 325 mg)  °3:00 PM Motrin 600 mg (3 pills of 200 mg)  °6:00 PM Tylenol 650 mg (2 pills of 325 mg)  °9:00 PM Motrin 600 mg (3 pills of 200 mg)  °Continue alternating every 3 hours  ° °We recommend that you follow this schedule around-the-clock for at least 3 days after surgery, or until you feel that it is no longer needed. Use the table on the last page of this handout to keep track of the medications you are taking. °Important: °Do not take more than 3000mg of Tylenol or 3200mg of Motrin in a 24-hour period. °Do not take ibuprofen/Motrin if you have a history of bleeding stomach ulcers, severe kidney disease, &/or actively taking a blood thinner ° °What if I still have pain? °If you have pain that is not controlled with the over-the-counter pain medications (Tylenol and Motrin or Advil) you might have what we call “breakthrough” pain. You will receive a prescription   for a small amount of an opioid pain medication such as Oxycodone, Tramadol, or Tylenol with Codeine. Use these opioid pills in the first 24 hours after surgery if you have breakthrough pain. Do not take more than 1 pill every 4-6 hours.  If you still have uncontrolled pain after using all opioid pills, don't hesitate to call our staff using the number provided. We will help make sure you are managing your pain in the best way possible, and if necessary, we can provide a prescription for additional pain medication.   Day 1    Time  Name of Medication Number of pills taken  Amount of Acetaminophen  Pain Level   Comments  AM PM       AM PM       AM PM       AM PM       AM PM       AM PM       AM PM       AM PM       Total Daily amount of Acetaminophen Do not take more than  3,000 mg per day      Day 2    Time  Name of Medication  Number of pills taken  Amount of Acetaminophen  Pain Level   Comments  AM PM       AM PM       AM PM       AM PM       AM PM       AM PM       AM PM       AM PM       Total Daily amount of Acetaminophen Do not take more than  3,000 mg per day      Day 3    Time  Name of Medication Number of pills taken  Amount of Acetaminophen  Pain Level   Comments  AM PM       AM PM       AM PM       AM PM          AM PM       AM PM       AM PM       AM PM       Total Daily amount of Acetaminophen Do not take more than  3,000 mg per day      Day 4    Time  Name of Medication Number of pills taken  Amount of Acetaminophen  Pain Level   Comments  AM PM       AM PM       AM PM       AM PM       AM PM       AM PM       AM PM       AM PM       Total Daily amount of Acetaminophen Do not take more than  3,000 mg per day      Day 5    Time  Name of Medication Number of pills taken  Amount of Acetaminophen  Pain Level   Comments  AM PM       AM PM       AM PM       AM PM       AM PM       AM   PM       AM PM       AM PM       Total Daily amount of Acetaminophen Do not take more than  3,000 mg per day       Day 6    Time  Name of Medication Number of pills taken  Amount of Acetaminophen  Pain Level  Comments  AM PM       AM PM       AM PM       AM PM       AM PM       AM PM       AM PM       AM PM       Total Daily amount of Acetaminophen Do not take more than  3,000 mg per day      Day 7    Time  Name of Medication Number of pills taken  Amount of Acetaminophen  Pain Level   Comments  AM PM       AM PM       AM PM       AM PM       AM PM       AM PM       AM PM       AM PM       Total Daily amount of Acetaminophen Do not take more than  3,000 mg per day        For additional information about how and where to safely dispose of unused opioid medications - [email or web address]  Disclaimer: This document  contains information and/or instructional materials adapted from [facility] for the typical patient with your condition. It does not replace medical advice from your health care provider because your experience may differ from that of the typical patient. Talk to your health care provider if you have any questions about this document, your condition or your treatment plan. Adapted from [facility]     Bariatric Surgery You have so much to gain by losing weight. You may have already tried every diet and exercise plan imaginable. And, you may have sought advice from your family physician, too.  Sometimes, in spite of such diligent efforts, you may not be able to achieve long-term results by yourself. In cases of severe obesity, bariatric or weight loss surgery is a proven method of achieving long-term weight control.  Our Services Our bariatric surgery programs offer our patients new hope and a long-term weight-loss solution. Since introducing our services in 2003, we have conducted more than 3000 successful procedures. Our program is designated as an Investment banker, corporate by  [employer] & American Society of Metabolic & Bariatric Surgery [facility]) and is also designated as a Engineer, manufacturing systems by [employer].  Our exceptional weight-loss surgery team specializes in diagnosis, treatment, follow-up care and ongoing support for our patients with severe weight loss challenges.  We currently offer laparoscopic adjustable gastric band (LAP-BAND), gastric bypass, and sleeve gastrectomy.    Attend our Bariatrics Seminar Choosing to undergo a bariatric procedure is a big decision, and one that should not be taken lightly. You now have two options in how you learn more about weight-loss surgery - in person or online. Our objective is to ensure you have all of the information that you need to evaluate the advantages and obligations of this life-changing procedure.  Please note that you are not alone  in this process, and our experienced team is ready to assist and answer all of your questions.  There are several ways to register for a seminar (either on-line or in person):   1) Call [phone] 2) Go on-line to [facility] and register for either type of  seminar.    [email or web address]

## 2021-07-05 NOTE — Transfer of Care (Signed)
Immediate Anesthesia Transfer of Care Note  Patient: Elizabeth Rios  Procedure(s) Performed: LAPAROSCOPIC CHOLECYSTECTOMY (Abdomen)  Patient Location: PACU  Anesthesia Type:General  Level of Consciousness: awake, alert , oriented and patient cooperative  Airway & Oxygen Therapy: Patient Spontanous Breathing and Patient connected to face mask oxygen  Post-op Assessment: Report given to RN, Post -op Vital signs reviewed and stable and Patient moving all extremities  Post vital signs: Reviewed and stable  Last Vitals:  Vitals Value Taken Time  BP 147/102 07/05/21 1339  Temp    Pulse 66 07/05/21 1342  Resp 25 07/05/21 1342  SpO2 100 % 07/05/21 1342  Vitals shown include unvalidated device data.  Last Pain:  Vitals:   07/05/21 1145  TempSrc:   PainSc: 0-No pain         Complications: No notable events documented.

## 2021-07-05 NOTE — Interval H&P Note (Signed)
History and Physical Interval Note:  07/05/2021 12:06 PM  Elizabeth Rios  has presented today for surgery, with the diagnosis of CHOLECYSTITIS.  The various methods of treatment have been discussed with the patient and family. After consideration of risks, benefits and other options for treatment, the patient has consented to  Procedure(s): LAPAROSCOPIC CHOLECYSTECTOMY (N/A) as a surgical intervention.  The patient's history has been reviewed, patient examined, no change in status, stable for surgery.  I have reviewed the patient's chart and labs.  Questions were answered to the patient's satisfaction.    Pt seen & examined Obese, RUQ TTP Discussed gallbladder disease and fatty liver Pt interested in learning about bariatric surgery - will provided info about our seminars on discharge  I believe the patient's symptoms are consistent with gallbladder disease.  We discussed gallbladder disease. T  I discussed laparoscopic cholecystectomy with possible IOC in detail.  The patient was diagrams detailing the procedure.  We discussed the risks and benefits of a laparoscopic cholecystectomy including, but not limited to bleeding, infection, injury to surrounding structures such as the intestine or liver, bile leak, retained gallstones, need to convert to an open procedure, prolonged diarrhea, blood clots such as  DVT, common bile duct injury, anesthesia risks, and possible need for additional procedures.  We discussed the typical post-operative recovery course. I explained that the likelihood of improvement of their symptoms is good.   Gaynelle Adu

## 2021-07-05 NOTE — ED Notes (Signed)
Spoke to receiving nurse Gunnar Fusi, RN.  Per Gunnar Fusi, RN pt has been accepted to floor and bed is ready.  Informed Gunnar Fusi, RN that pt will be going to surgery at 12:45, per short stay pt may be picked up at 11 am.

## 2021-07-06 ENCOUNTER — Encounter (HOSPITAL_COMMUNITY): Payer: Self-pay | Admitting: General Surgery

## 2021-07-06 LAB — SURGICAL PATHOLOGY

## 2021-07-06 MED ORDER — CYCLOBENZAPRINE HCL 10 MG PO TABS: 10.0000 mg | ORAL_TABLET | Freq: Two times a day (BID) | ORAL | 0 refills | Status: DC | PRN

## 2021-07-06 NOTE — Discharge Summary (Signed)
Discharge summary Addendum: Patient stayed overnight due to some pain which has resolved this morning.  She is stable on POD 1 for DC home as she is tolerating her diet, pain is controlled, and she is mobilizing.  PE: Abd: soft, appropriately tender, +BS, incisions c/d/I  Dc diagnosis: POD 1, S/p lap chole  Please see other DC summary for further details.  Letha Cape 9:33 AM 07/06/2021

## 2021-07-06 NOTE — Progress Notes (Signed)
Discharge instructions given to patient and all questions were answered.  

## 2021-12-12 DIAGNOSIS — I10 Essential (primary) hypertension: Secondary | ICD-10-CM | POA: Diagnosis present

## 2022-02-22 IMAGING — US US ABDOMEN LIMITED
1 series · 14 of 25 positions shown · non-contrast
Comparison: None.

CLINICAL DATA: Right upper quadrant pain.

EXAM:
ULTRASOUND ABDOMEN LIMITED RIGHT UPPER QUADRANT

[Series 1: us abdomen limited · 14 of 53 slices shown]
[im 1/53]
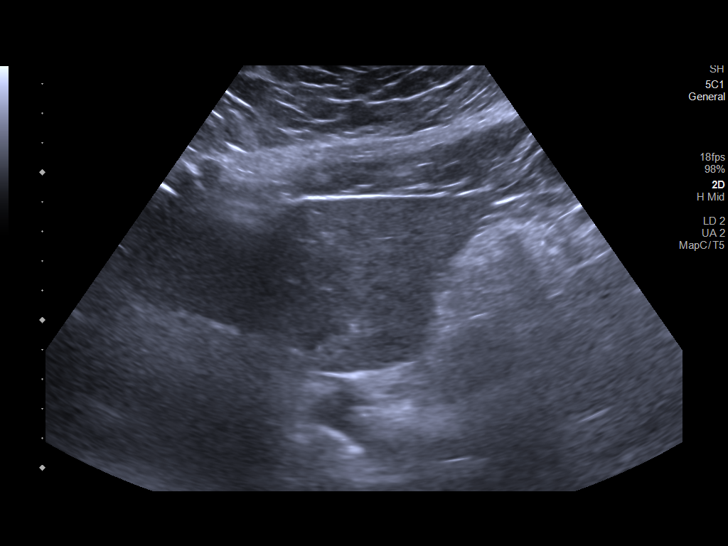
[im 5/53]
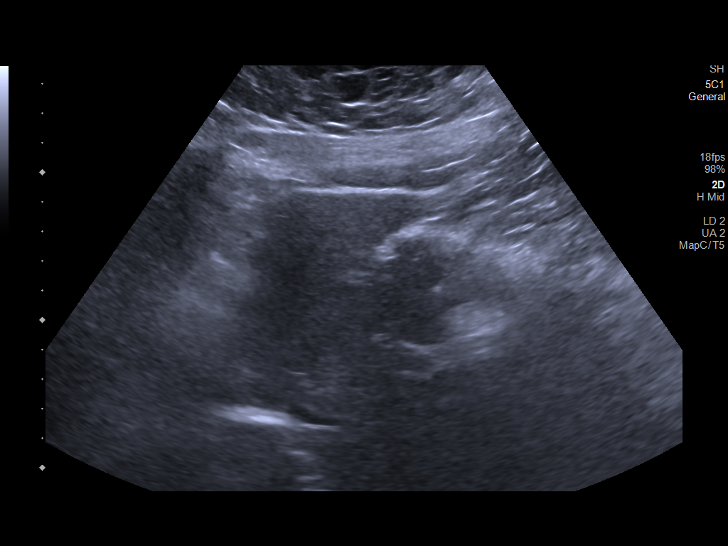
[im 9/53]
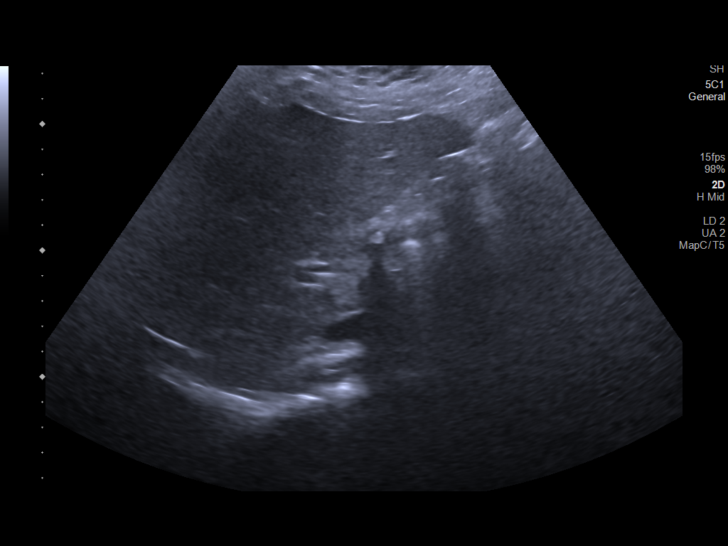
[im 14/53]
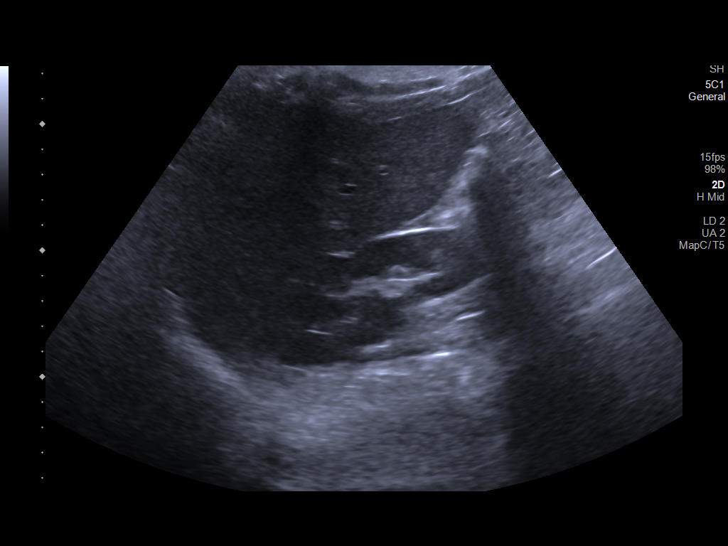
[im 18/53]
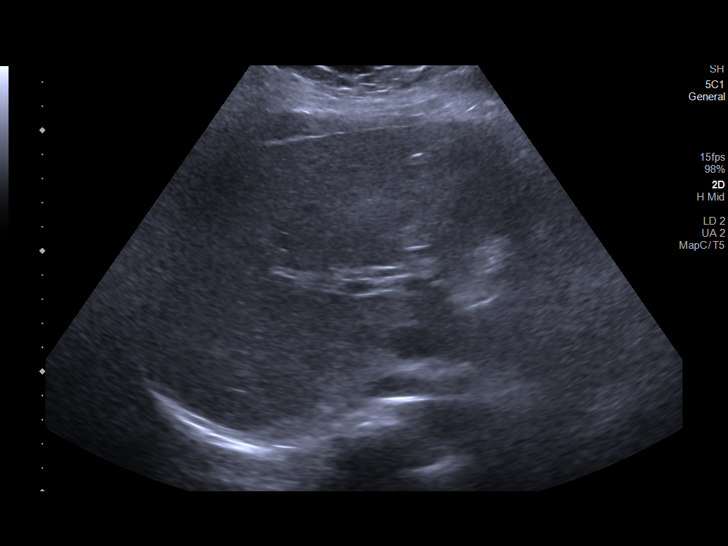
[im 20/53]
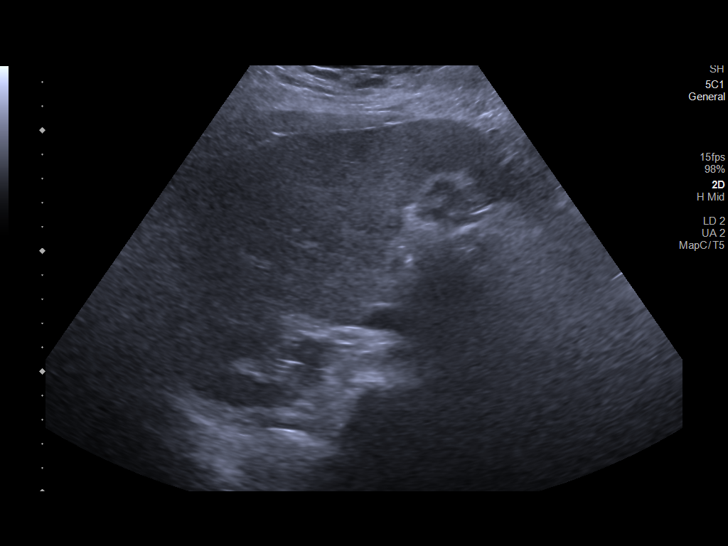
[im 24/53]
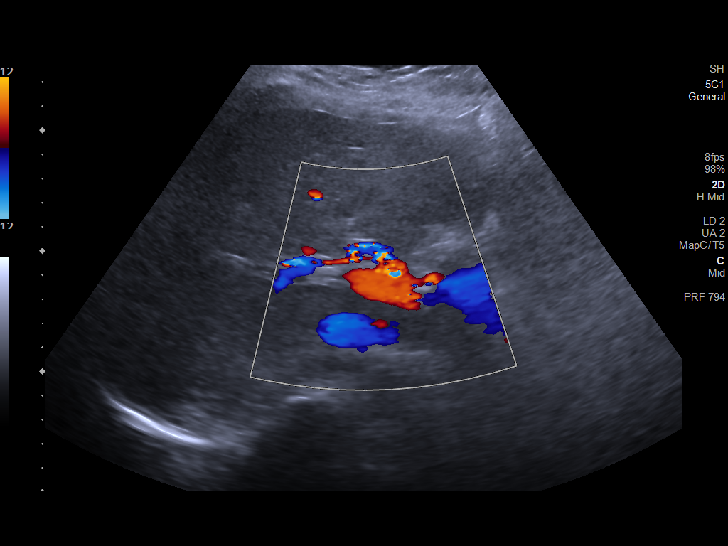
[im 29/53]
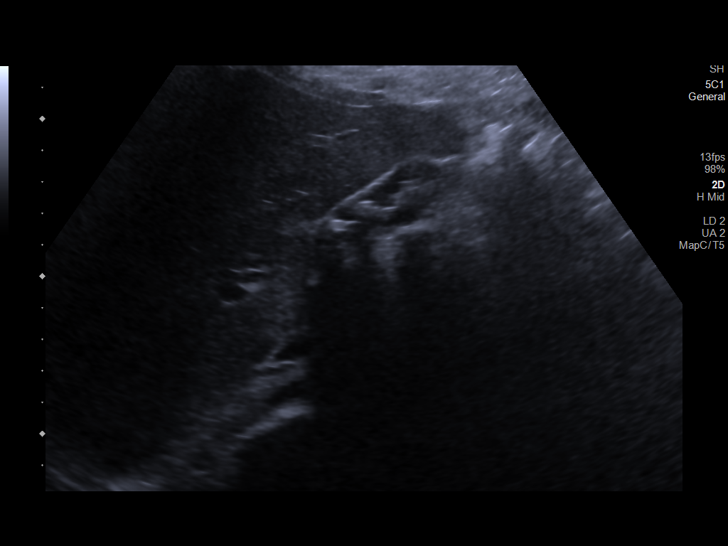
[im 33/53]
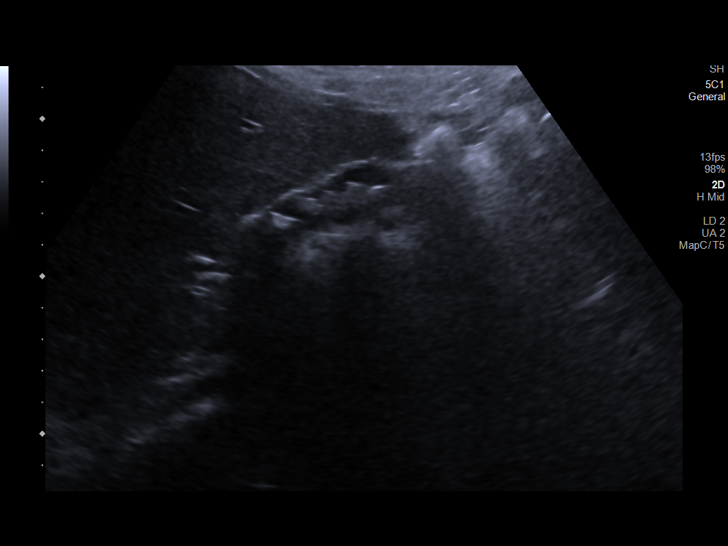
[im 35/53]
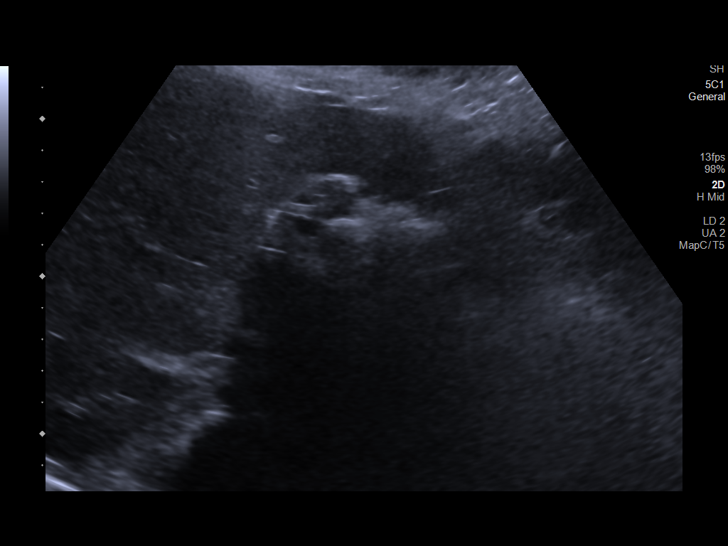
[im 40/53]
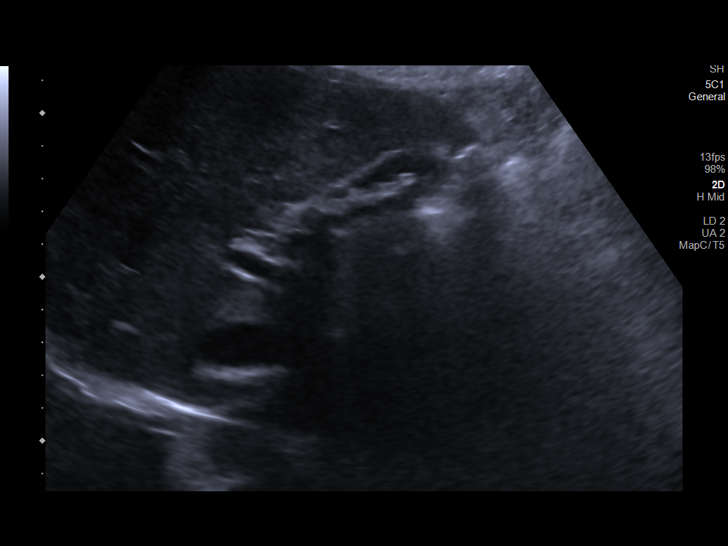
[im 44/53]
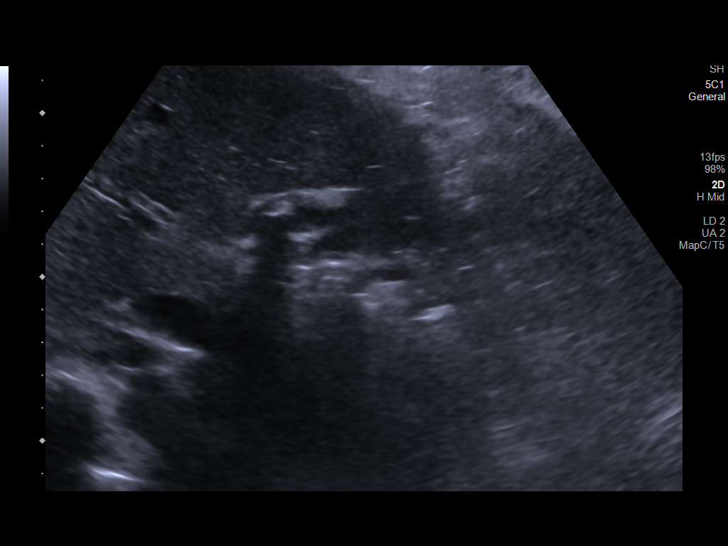
[im 48/53]
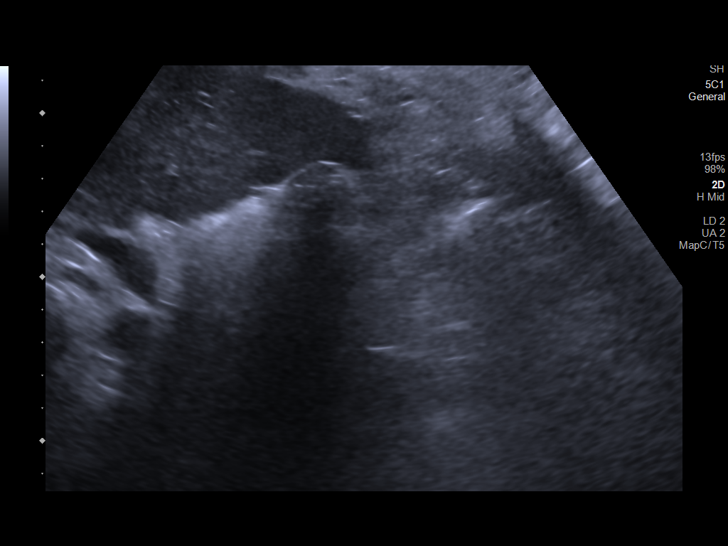
[im 53/53]
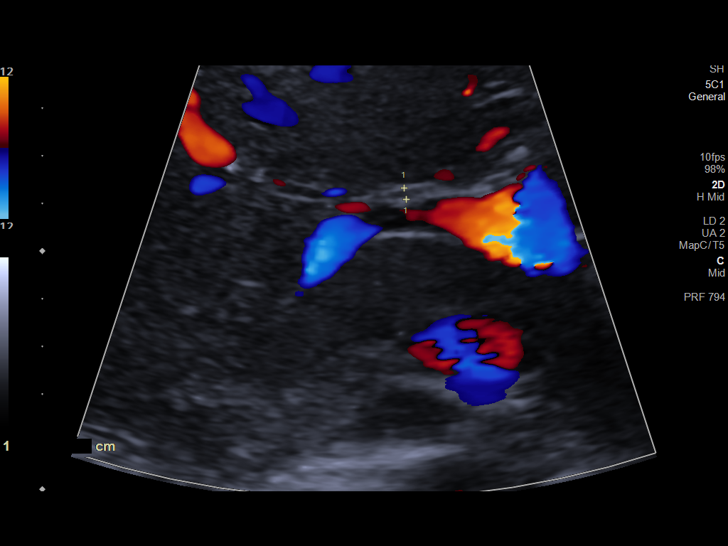

[14 of 25 positions shown; findings below may reference images not displayed]

FINDINGS: Gallbladder:

Multiple shadowing echogenic gallstones are seen within the
gallbladder lumen. The largest measures approximately 1.3 cm. There
is no evidence of gallbladder wall thickening (2.4 mm). A positive
sonographic Murphy's sign is noted by sonographer.

Common bile duct:

Diameter: 2.4 mm

Liver:

No focal lesion identified. Diffusely increased echogenicity of the
liver parenchyma is noted. Portal vein is patent on color Doppler
imaging with normal direction of blood flow towards the liver.

Other: None.
IMPRESSION: 1. Cholelithiasis in the setting of a positive sonographic Murphy's
sign which is suggestive of acute cholecystitis.
2. Fatty liver.

## 2022-08-07 DIAGNOSIS — Z9884 Bariatric surgery status: Secondary | ICD-10-CM | POA: Insufficient documentation

## 2023-03-06 ENCOUNTER — Encounter (HOSPITAL_BASED_OUTPATIENT_CLINIC_OR_DEPARTMENT_OTHER): Payer: Self-pay | Admitting: Emergency Medicine

## 2023-03-06 ENCOUNTER — Emergency Department (HOSPITAL_BASED_OUTPATIENT_CLINIC_OR_DEPARTMENT_OTHER): Payer: BC Managed Care – PPO

## 2023-03-06 ENCOUNTER — Inpatient Hospital Stay (HOSPITAL_BASED_OUTPATIENT_CLINIC_OR_DEPARTMENT_OTHER)
Admission: EM | Admit: 2023-03-06 | Discharge: 2023-03-09 | DRG: 690 | Disposition: A | Discharge status: Home / Self Care | Payer: BC Managed Care – PPO | Attending: Family Medicine | Admitting: Family Medicine

## 2023-03-06 ENCOUNTER — Other Ambulatory Visit: Payer: Self-pay

## 2023-03-06 DIAGNOSIS — I10 Essential (primary) hypertension: Secondary | ICD-10-CM | POA: Diagnosis present

## 2023-03-06 DIAGNOSIS — R748 Abnormal levels of other serum enzymes: Secondary | ICD-10-CM | POA: Diagnosis present

## 2023-03-06 DIAGNOSIS — Z9049 Acquired absence of other specified parts of digestive tract: Secondary | ICD-10-CM

## 2023-03-06 DIAGNOSIS — K5900 Constipation, unspecified: Secondary | ICD-10-CM | POA: Diagnosis present

## 2023-03-06 DIAGNOSIS — Z833 Family history of diabetes mellitus: Secondary | ICD-10-CM

## 2023-03-06 DIAGNOSIS — K219 Gastro-esophageal reflux disease without esophagitis: Secondary | ICD-10-CM | POA: Diagnosis present

## 2023-03-06 DIAGNOSIS — B9620 Unspecified Escherichia coli [E. coli] as the cause of diseases classified elsewhere: Secondary | ICD-10-CM | POA: Diagnosis present

## 2023-03-06 DIAGNOSIS — N10 Acute tubulo-interstitial nephritis: Secondary | ICD-10-CM | POA: Diagnosis not present

## 2023-03-06 DIAGNOSIS — Z8719 Personal history of other diseases of the digestive system: Secondary | ICD-10-CM

## 2023-03-06 DIAGNOSIS — Z6838 Body mass index (BMI) 38.0-38.9, adult: Secondary | ICD-10-CM

## 2023-03-06 DIAGNOSIS — Z8249 Family history of ischemic heart disease and other diseases of the circulatory system: Secondary | ICD-10-CM

## 2023-03-06 DIAGNOSIS — E876 Hypokalemia: Secondary | ICD-10-CM | POA: Diagnosis present

## 2023-03-06 DIAGNOSIS — E669 Obesity, unspecified: Secondary | ICD-10-CM | POA: Diagnosis present

## 2023-03-06 DIAGNOSIS — G8929 Other chronic pain: Secondary | ICD-10-CM | POA: Diagnosis present

## 2023-03-06 DIAGNOSIS — N12 Tubulo-interstitial nephritis, not specified as acute or chronic: Principal | ICD-10-CM

## 2023-03-06 DIAGNOSIS — R091 Pleurisy: Secondary | ICD-10-CM

## 2023-03-06 DIAGNOSIS — Z9884 Bariatric surgery status: Secondary | ICD-10-CM

## 2023-03-06 LAB — BASIC METABOLIC PANEL
Anion gap: 12 (ref 5–15)
BUN: 12 mg/dL (ref 6–20)
CO2: 24 mmol/L (ref 22–32)
Calcium: 8.6 mg/dL — ABNORMAL LOW (ref 8.9–10.3)
Chloride: 100 mmol/L (ref 98–111)
Creatinine, Ser: 0.92 mg/dL (ref 0.44–1.00)
GFR, Estimated: >60 mL/min (ref >60)
Glucose, Bld: 108 mg/dL — ABNORMAL HIGH (ref 70–99)
Potassium: 3.3 mmol/L — ABNORMAL LOW (ref 3.5–5.1)
Sodium: 136 mmol/L (ref 135–145)

## 2023-03-06 LAB — CBC
HCT: 38.3 % (ref 36.0–46.0)
Hemoglobin: 13.1 g/dL (ref 12.0–15.0)
MCH: 30.9 pg (ref 26.0–34.0)
MCHC: 34.2 g/dL (ref 30.0–36.0)
MCV: 90.3 fL (ref 80.0–100.0)
Platelets: 159 10*3/uL (ref 150–400)
RBC: 4.24 MIL/uL (ref 3.87–5.11)
RDW: 12.7 % (ref 11.5–15.5)
WBC: 21.2 10*3/uL — ABNORMAL HIGH (ref 4.0–10.5)
nRBC: 0 % (ref 0.0–0.2)

## 2023-03-06 MED ORDER — ONDANSETRON HCL 4 MG/2ML IJ SOLN
4.0000 mg | Freq: Once | INTRAMUSCULAR | Status: AC
  Administered 2023-03-07: 4 mg via INTRAVENOUS
  Filled 2023-03-06: qty 2

## 2023-03-06 MED ORDER — SODIUM CHLORIDE 0.9 % IV BOLUS
1000.0000 mL | Freq: Once | INTRAVENOUS | Status: AC
  Administered 2023-03-07: 1000 mL via INTRAVENOUS

## 2023-03-06 MED ORDER — FENTANYL CITRATE PF 50 MCG/ML IJ SOSY
50.0000 ug | PREFILLED_SYRINGE | Freq: Once | INTRAMUSCULAR | Status: AC
  Administered 2023-03-07: 50 ug via INTRAVENOUS
  Filled 2023-03-06: qty 1

## 2023-03-06 NOTE — ED Triage Notes (Signed)
Pt reports getting a steroid shot in her back on Tues, since then has not felt well, now having CP, ShoB, dizziness, denies n/v

## 2023-03-06 NOTE — ED Triage Notes (Signed)
Pt reports CP worsens with inspiration

## 2023-03-06 NOTE — ED Provider Notes (Signed)
[place] [facility] AT [facility] Provider Note   CSN: [id number] Arrival date & time: 03/06/23  2012     History  Chief Complaint  Patient presents with   Chest Pain    Elizabeth Rios is a 47 y.o. female.  The history is provided by the patient and the spouse.  Patient with history of obesity presents with multiple complaints.  Patient reports she had a steroid spinal injection About 2 days ago.  Since that time she has had pain throughout her body.  She reports she had back pain that is now moving into her upper abdomen.  She is now having chest pain that is worse with breathing.  No fevers or vomiting.  She reports shortness of breath.  She also reports tingling and numbness in her left arm that is improving.  No new weakness.  No previous history of CAD/VTE.  She is a non-smoker  Previous surgery includes gastric sleeve and cholecystectomy Past Medical History:  Diagnosis Date   Chlamydia 02/09/2021   Malpositioned IUD 07/05/2021    Home Medications Prior to Admission medications   Medication Sig Start Date End Date Taking? Authorizing Provider  Vitamin D, Ergocalciferol, (DRISDOL) 1.25 MG (50000 UNIT) CAPS capsule Take 50,000 Units by mouth once a week. Friday 03/02/21   [provider]      Allergies    Patient has no known allergies.    Review of Systems   Review of Systems  Constitutional:  Positive for fatigue.  Cardiovascular:  Positive for chest pain.  Gastrointestinal:  Positive for abdominal pain and constipation.    Physical Exam Updated Vital Signs BP (!) 143/89   Pulse (!) 120   Temp 98.3 F (36.8 C)   Resp 19   Ht 1.575 m (5\' 2" )   Wt 92.1 kg   SpO2 99%   BMI 37.13 kg/m  Physical Exam CONSTITUTIONAL: Well developed/well nourished, anxious HEAD: Normocephalic/atraumatic EYES: EOMI/PERRL ENMT: Mucous membranes moist NECK: supple no meningeal signs SPINE/BACK: Diffuse thoracic and lumbar tenderness, No  erythema or bruising No bruising/crepitance/stepoffs noted to spine CV: S1/S2 noted, no murmurs/rubs/gallops noted LUNGS: Lungs are clear to auscultation bilaterally, no apparent distress ABDOMEN: soft, moderate RUQ and epigastric tenderness, no rebound or guarding, bowel sounds noted throughout abdomen GU:no cva tenderness NEURO: Pt is awake/alert/appropriate, moves all extremitiesx4.  No facial droop.  Equal handgrips.  No arm or leg drift.  No obvious sensory deficit noted EXTREMITIES: pulses normal/equalx4, full ROM SKIN: warm, color normal PSYCH: Anxious ED Results / Procedures / Treatments   Labs (all labs ordered are listed, but only abnormal results are displayed) Labs Reviewed  BASIC METABOLIC PANEL - Abnormal; Notable for the following components:      Result Value   Potassium 3.3 (*)    Glucose, Bld 108 (*)    Calcium 8.6 (*)    All other components within normal limits  CBC - Abnormal; Notable for the following components:   WBC 21.2 (*)    All other components within normal limits  HEPATIC FUNCTION PANEL - Abnormal; Notable for the following components:   Total Bilirubin 1.4 (*)    Bilirubin, Direct 0.4 (*)    Indirect Bilirubin 1.0 (*)    All other components within normal limits  D-DIMER, QUANTITATIVE - Abnormal; Notable for the following components:   D-Dimer, Quant 1.90 (*)    All other components within normal limits  URINALYSIS, ROUTINE W REFLEX MICROSCOPIC - Abnormal; Notable for the following  components:   APPearance CLOUDY (*)    Hgb urine dipstick MODERATE (*)    Ketones, ur 40 (*)    Protein, ur 30 (*)    Leukocytes,Ua MODERATE (*)    All other components within normal limits  URINALYSIS, MICROSCOPIC (REFLEX) - Abnormal; Notable for the following components:   Bacteria, UA MANY (*)    All other components within normal limits  URINE CULTURE  LIPASE, BLOOD  PREGNANCY, URINE  LACTIC ACID, PLASMA  TROPONIN I (HIGH SENSITIVITY)    EKG EKG  Interpretation  Date/Time:  Thursday Mar 06 2023 20:25:03 EDT Ventricular Rate:  95 PR Interval:  163 QRS Duration: 87 QT Interval:  333 QTC Calculation: 419 R Axis:   32 Text Interpretation: Sinus rhythm Probable left atrial enlargement Low voltage, precordial leads Confirmed by Zadie Rhine ([id number]) on 03/06/2023 11:34:53 PM  Radiology CT Angio Chest PE W and/or Wo Contrast  Result Date: 03/07/2023 CLINICAL DATA:  Pulmonary embolism (PE) suspected, high prob; Abdominal pain, acute, nonlocalized. Chest pain, dyspnea, dizziness. EXAM: CT ANGIOGRAPHY CHEST CT ABDOMEN AND PELVIS WITH CONTRAST TECHNIQUE: Multidetector CT imaging of the chest was performed using the standard protocol during bolus administration of intravenous contrast. Multiplanar CT image reconstructions and MIPs were obtained to evaluate the vascular anatomy. Multidetector CT imaging of the abdomen and pelvis was performed using the standard protocol during bolus administration of intravenous contrast. RADIATION DOSE REDUCTION: This exam was performed according to the departmental dose-optimization program which includes automated exposure control, adjustment of the mA and/or kV according to patient size and/or use of iterative reconstruction technique. CONTRAST:  OMNIPAQUE IOHEXOL 350 MG/ML SOLN COMPARISON:  None Available. FINDINGS: CTA CHEST FINDINGS Cardiovascular: Adequate opacification of the pulmonary arterial tree. No intraluminal filling defect identified to suggest acute pulmonary embolism. Central pulmonary arteries are of normal caliber. No significant coronary artery calcification. Cardiac size within normal limits. No pericardial effusion. No significant atherosclerotic calcification within the thoracic aorta. No aortic aneurysm. Mediastinum/Nodes: No enlarged mediastinal, hilar, or axillary lymph nodes. Thyroid gland, trachea, and esophagus demonstrate no significant findings. Lungs/Pleura: Lungs are clear. No  pleural effusion or pneumothorax. Musculoskeletal: No acute bone abnormality. No lytic or blastic bone lesion. Review of the MIP images confirms the above findings. CT ABDOMEN and PELVIS FINDINGS Hepatobiliary: No focal liver abnormality is seen. Status post cholecystectomy. No biliary dilatation. Pancreas: Unremarkable Spleen: Unremarkable Adrenals/Urinary Tract: The adrenal glands are unremarkable. The kidneys are normal in size and position. There is heterogeneous cortical enhancement bilaterally with wedge like regions of hypoenhancement bilaterally in keeping with an underlying infectious or inflammatory process, most commonly pyelonephritis. No hydronephrosis. Mild bilateral peripelvic and proximal periureteric inflammatory stranding. No intrarenal or ureteral calculi. No perinephric fluid collections. The bladder is unremarkable. Stomach/Bowel: Surgical changes of gastric sleeve resection are identified. The stomach, small bowel, large bowel are otherwise unremarkable. Appendix normal. No free intraperitoneal gas. Mild free fluid within the pelvis may be physiologic in a patient of this age. Vascular/Lymphatic: No significant vascular findings are present. No enlarged abdominal or pelvic lymph nodes. Reproductive: Intrauterine device is in place in expected position within the uterine cavity. The pelvic organs are otherwise unremarkable. Other: No abdominal wall hernia. Musculoskeletal: No acute or significant osseous findings. Review of the MIP images confirms the above findings. IMPRESSION: 1. No pulmonary embolism. No acute intrathoracic pathology identified. 2. Heterogeneous cortical enhancement bilaterally with wedge like regions of hypoenhancement bilaterally in keeping with an underlying infectious or inflammatory process, most commonly pyelonephritis. Correlation  with urinalysis and urine culture would be helpful for confirmation. No hydronephrosis. No perinephric fluid collections. 3. Mild free fluid  within the pelvis, likely physiologic in a patient of this age. 4. Status post gastric sleeve resection and cholecystectomy. Electronically Signed   By: Helyn Numbers M.D.   On: 03/07/2023 01:17   CT ABDOMEN PELVIS W CONTRAST  Result Date: 03/07/2023 CLINICAL DATA:  Pulmonary embolism (PE) suspected, high prob; Abdominal pain, acute, nonlocalized. Chest pain, dyspnea, dizziness. EXAM: CT ANGIOGRAPHY CHEST CT ABDOMEN AND PELVIS WITH CONTRAST TECHNIQUE: Multidetector CT imaging of the chest was performed using the standard protocol during bolus administration of intravenous contrast. Multiplanar CT image reconstructions and MIPs were obtained to evaluate the vascular anatomy. Multidetector CT imaging of the abdomen and pelvis was performed using the standard protocol during bolus administration of intravenous contrast. RADIATION DOSE REDUCTION: This exam was performed according to the departmental dose-optimization program which includes automated exposure control, adjustment of the mA and/or kV according to patient size and/or use of iterative reconstruction technique. CONTRAST:  OMNIPAQUE IOHEXOL 350 MG/ML SOLN COMPARISON:  None Available. FINDINGS: CTA CHEST FINDINGS Cardiovascular: Adequate opacification of the pulmonary arterial tree. No intraluminal filling defect identified to suggest acute pulmonary embolism. Central pulmonary arteries are of normal caliber. No significant coronary artery calcification. Cardiac size within normal limits. No pericardial effusion. No significant atherosclerotic calcification within the thoracic aorta. No aortic aneurysm. Mediastinum/Nodes: No enlarged mediastinal, hilar, or axillary lymph nodes. Thyroid gland, trachea, and esophagus demonstrate no significant findings. Lungs/Pleura: Lungs are clear. No pleural effusion or pneumothorax. Musculoskeletal: No acute bone abnormality. No lytic or blastic bone lesion. Review of the MIP images confirms the above findings. CT  ABDOMEN and PELVIS FINDINGS Hepatobiliary: No focal liver abnormality is seen. Status post cholecystectomy. No biliary dilatation. Pancreas: Unremarkable Spleen: Unremarkable Adrenals/Urinary Tract: The adrenal glands are unremarkable. The kidneys are normal in size and position. There is heterogeneous cortical enhancement bilaterally with wedge like regions of hypoenhancement bilaterally in keeping with an underlying infectious or inflammatory process, most commonly pyelonephritis. No hydronephrosis. Mild bilateral peripelvic and proximal periureteric inflammatory stranding. No intrarenal or ureteral calculi. No perinephric fluid collections. The bladder is unremarkable. Stomach/Bowel: Surgical changes of gastric sleeve resection are identified. The stomach, small bowel, large bowel are otherwise unremarkable. Appendix normal. No free intraperitoneal gas. Mild free fluid within the pelvis may be physiologic in a patient of this age. Vascular/Lymphatic: No significant vascular findings are present. No enlarged abdominal or pelvic lymph nodes. Reproductive: Intrauterine device is in place in expected position within the uterine cavity. The pelvic organs are otherwise unremarkable. Other: No abdominal wall hernia. Musculoskeletal: No acute or significant osseous findings. Review of the MIP images confirms the above findings. IMPRESSION: 1. No pulmonary embolism. No acute intrathoracic pathology identified. 2. Heterogeneous cortical enhancement bilaterally with wedge like regions of hypoenhancement bilaterally in keeping with an underlying infectious or inflammatory process, most commonly pyelonephritis. Correlation with urinalysis and urine culture would be helpful for confirmation. No hydronephrosis. No perinephric fluid collections. 3. Mild free fluid within the pelvis, likely physiologic in a patient of this age. 4. Status post gastric sleeve resection and cholecystectomy. Electronically Signed   By: Helyn Numbers  M.D.   On: 03/07/2023 01:17   DG Chest 2 View  Result Date: 03/06/2023 CLINICAL DATA:  Chest pain EXAM: CHEST - 2 VIEW COMPARISON:  Chest x-ray 01/24/2018 FINDINGS: The heart size and mediastinal contours are within normal limits. Both lungs are clear. The visualized  skeletal structures are unremarkable. IMPRESSION: No active cardiopulmonary disease. Electronically Signed   By: Darliss Cheney M.D.   On: 03/06/2023 20:59    Procedures .Critical Care  Performed by: Zadie Rhine, MD Authorized by: Zadie Rhine, MD   Critical care provider statement:    Critical care time (minutes):  80   Critical care start time:  03/07/2023 2:10 AM   Critical care end time:  03/07/2023 3:30 AM   Critical care time was exclusive of:  Separately billable procedures and treating other patients   Critical care was necessary to treat or prevent imminent or life-threatening deterioration of the following conditions:  Sepsis, shock, respiratory failure and cardiac failure   Critical care was time spent personally by me on the following activities:  Obtaining history from patient or surrogate, examination of patient, development of treatment plan with patient or surrogate, discussions with consultants, re-evaluation of patient's condition, pulse oximetry, ordering and review of radiographic studies, ordering and review of laboratory studies, ordering and performing treatments and interventions, review of old charts and evaluation of patient's response to treatment   I assumed direction of critical care for this patient from another provider in my specialty: no     Care discussed with: admitting provider       Medications Ordered in ED Medications  cefTRIAXone (ROCEPHIN) 1 g in sodium chloride 0.9 % 100 mL IVPB (0 g Intravenous Stopped 03/07/23 0210)  0.9 %  sodium chloride infusion ( Intravenous New Bag/Given 03/07/23 0139)  fentaNYL (SUBLIMAZE) injection 50 mcg (50 mcg Intravenous Given 03/07/23 0009)   ondansetron (ZOFRAN) injection 4 mg (4 mg Intravenous Given 03/07/23 0007)  sodium chloride 0.9 % bolus 1,000 mL ( Intravenous Stopped 03/07/23 0141)  iohexol (OMNIPAQUE) 350 MG/ML injection 100 mL (100 mLs Intravenous Contrast Given 03/07/23 0047)  fentaNYL (SUBLIMAZE) injection 100 mcg (100 mcg Intravenous Given 03/07/23 0132)  ketorolac (TORADOL) 15 MG/ML injection 15 mg (15 mg Intravenous Given 03/07/23 0240)  acetaminophen (TYLENOL) tablet 650 mg (650 mg Oral Given 03/07/23 0341)  sodium chloride 0.9 % bolus 1,000 mL (1,000 mLs Intravenous New Bag/Given 03/07/23 0341)    ED Course/ Medical Decision Making/ A&P Clinical Course as of 03/07/23 0352  Thu Mar 06, 2023  2354 WBC(!): 21.2 leukocytosis [DW]  Fri Mar 07, 2023  0000 Patient had a recent spinal injection and since that time is having pain throughout her body.  Currently her pain is focused in the right upper quadrant epigastrium as well as chest pain.  At this point, this is not obviously related to her recent injection.  Labs are pending at this time [DW]  0039 Patient still reporting back pain and abdominal pain, appears uncomfortable.  D-dimer elevated.  Will obtain CT angio chest as well as CT abdomen pelvis [DW]  0148 Discussed CT findings with patient.  She denies any recent history of UTI symptoms.  However given CT findings and her urinalysis, will treat for pyelonephritis [DW]  0326 Patient now febrile up to 101, she is tachycardic and is still uncomfortable.  She will be admitted for IV fluids, IV antibiotics for pyelonephritis [DW]  [phone] Patient presented with multiple complaints, mostly had abdominal pain and pleuritic chest pain.  CT imaging of the chest was performed to rule out PE, no acute thoracic findings Patient was found to have pyelonephritis by imaging and labs.  She is now tachycardic and febrile though with a negative lactate.  Patient appears to be worsening.  She will now be admitted  to the hospital [DW]  [phone]  Discussed the case with Dr. Arlean Hopping for admission. [DW]    Clinical Course User Index [DW] Zadie Rhine, MD                             Medical Decision Making Amount and/or Complexity of Data Reviewed Labs: ordered. Decision-making details documented in ED Course. Radiology: ordered.  Risk OTC drugs. Prescription drug management.   This patient presents to the ED for concern of abdominal pain and chest pain, this involves an extensive number of treatment options, and is a complaint that carries with it a high risk of complications and morbidity.  The differential diagnosis includes but is not limited to acute coronary syndrome, aortic dissection, pulmonary embolism, pericarditis, pneumothorax, pneumonia, myocarditis, pleurisy, esophageal rupture cholecystitis, cholelithiasis, pancreatitis, gastritis, peptic ulcer disease, appendicitis, bowel obstruction, bowel perforation, diverticulitis, AAA, ischemic bowel, ectopic pregnancy, PID   Comorbidities that complicate the patient evaluation: Patient's presentation is complicated by their history of obesity  Additional history obtained: Additional history obtained from spouse Records reviewed  previous imaging reveals cholelithiasis  Lab Tests: I Ordered, and personally interpreted labs.  The pertinent results include leukocytosis  Imaging Studies ordered: I ordered imaging studies including X-ray chest   I independently visualized and interpreted imaging which showed no acute findings I agree with the radiologist interpretation  Cardiac Monitoring: The patient was maintained on a cardiac monitor.  I personally viewed and interpreted the cardiac monitor which showed an underlying rhythm of:  sinus rhythm  Medicines ordered and prescription drug management: I ordered medication including fenantyl  for pain  Reevaluation of the patient after these medicines showed that the patient    improved   Critical Interventions:   IV  fluids and IV antibiotics  Consultations Obtained: I requested consultation with the admitting physician [facility] , and discussed  findings as well as pertinent plan - they recommend: will admit  Reevaluation: After the interventions noted above, I reevaluated the patient and found that they have :improved  Complexity of problems addressed: Patient's presentation is most consistent with  acute presentation with potential threat to life or bodily function  Disposition: After consideration of the diagnostic results and the patient's response to treatment,  I feel that the patent would benefit from admission   .    Patient with recent steroid spinal injection, but no obvious complications at this time.  Patient will require treatment for pyelonephritis, but if she has any new or worsening back pain or weakness she may benefit from MRI       Final Clinical Impression(s) / ED Diagnoses Final diagnoses:  Pyelonephritis  Pleurisy    Rx / DC Orders ED Discharge Orders     None         Zadie Rhine, MD 03/07/23 [phone]

## 2023-03-07 ENCOUNTER — Emergency Department (HOSPITAL_BASED_OUTPATIENT_CLINIC_OR_DEPARTMENT_OTHER): Payer: BC Managed Care – PPO

## 2023-03-07 DIAGNOSIS — Z9884 Bariatric surgery status: Secondary | ICD-10-CM | POA: Diagnosis not present

## 2023-03-07 DIAGNOSIS — Z9049 Acquired absence of other specified parts of digestive tract: Secondary | ICD-10-CM | POA: Diagnosis not present

## 2023-03-07 DIAGNOSIS — B9620 Unspecified Escherichia coli [E. coli] as the cause of diseases classified elsewhere: Secondary | ICD-10-CM | POA: Diagnosis present

## 2023-03-07 DIAGNOSIS — I10 Essential (primary) hypertension: Secondary | ICD-10-CM | POA: Diagnosis present

## 2023-03-07 DIAGNOSIS — K5900 Constipation, unspecified: Secondary | ICD-10-CM | POA: Diagnosis present

## 2023-03-07 DIAGNOSIS — G8929 Other chronic pain: Secondary | ICD-10-CM | POA: Diagnosis present

## 2023-03-07 DIAGNOSIS — N10 Acute tubulo-interstitial nephritis: Secondary | ICD-10-CM | POA: Diagnosis present

## 2023-03-07 DIAGNOSIS — K219 Gastro-esophageal reflux disease without esophagitis: Secondary | ICD-10-CM | POA: Diagnosis present

## 2023-03-07 DIAGNOSIS — E876 Hypokalemia: Secondary | ICD-10-CM | POA: Diagnosis present

## 2023-03-07 DIAGNOSIS — R748 Abnormal levels of other serum enzymes: Secondary | ICD-10-CM | POA: Diagnosis present

## 2023-03-07 DIAGNOSIS — Z6838 Body mass index (BMI) 38.0-38.9, adult: Secondary | ICD-10-CM | POA: Diagnosis not present

## 2023-03-07 DIAGNOSIS — Z8249 Family history of ischemic heart disease and other diseases of the circulatory system: Secondary | ICD-10-CM | POA: Diagnosis not present

## 2023-03-07 DIAGNOSIS — N12 Tubulo-interstitial nephritis, not specified as acute or chronic: Secondary | ICD-10-CM | POA: Diagnosis not present

## 2023-03-07 DIAGNOSIS — E669 Obesity, unspecified: Secondary | ICD-10-CM | POA: Diagnosis present

## 2023-03-07 DIAGNOSIS — Z833 Family history of diabetes mellitus: Secondary | ICD-10-CM | POA: Diagnosis not present

## 2023-03-07 LAB — URINALYSIS, ROUTINE W REFLEX MICROSCOPIC
Bilirubin Urine: NEGATIVE
Glucose, UA: NEGATIVE mg/dL
Ketones, ur: 40 mg/dL — AB
Nitrite: NEGATIVE
Protein, ur: 30 mg/dL — AB
Specific Gravity, Urine: <=1.005 (ref 1.005–1.030)
pH: 5.5 (ref 5.0–8.0)

## 2023-03-07 LAB — PREGNANCY, URINE: Preg Test, Ur: NEGATIVE

## 2023-03-07 LAB — BASIC METABOLIC PANEL
Anion gap: 9 (ref 5–15)
BUN: 10 mg/dL (ref 6–20)
CO2: 21 mmol/L — ABNORMAL LOW (ref 22–32)
Calcium: 7.8 mg/dL — ABNORMAL LOW (ref 8.9–10.3)
Chloride: 106 mmol/L (ref 98–111)
Creatinine, Ser: 0.81 mg/dL (ref 0.44–1.00)
GFR, Estimated: >60 mL/min (ref >60)
Glucose, Bld: 92 mg/dL (ref 70–99)
Potassium: 3.3 mmol/L — ABNORMAL LOW (ref 3.5–5.1)
Sodium: 136 mmol/L (ref 135–145)

## 2023-03-07 LAB — URINALYSIS, MICROSCOPIC (REFLEX): WBC, UA: >50 WBC/hpf (ref 0–5)

## 2023-03-07 LAB — HEPATIC FUNCTION PANEL
ALT: 24 U/L (ref 0–44)
AST: 21 U/L (ref 15–41)
Albumin: 3.5 g/dL (ref 3.5–5.0)
Alkaline Phosphatase: 65 U/L (ref 38–126)
Bilirubin, Direct: 0.4 mg/dL — ABNORMAL HIGH (ref 0.0–0.2)
Indirect Bilirubin: 1 mg/dL — ABNORMAL HIGH (ref 0.3–0.9)
Total Bilirubin: 1.4 mg/dL — ABNORMAL HIGH (ref 0.3–1.2)
Total Protein: 7.4 g/dL (ref 6.5–8.1)

## 2023-03-07 LAB — CBC
HCT: 33.4 % — ABNORMAL LOW (ref 36.0–46.0)
Hemoglobin: 11.6 g/dL — ABNORMAL LOW (ref 12.0–15.0)
MCH: 31.8 pg (ref 26.0–34.0)
MCHC: 34.7 g/dL (ref 30.0–36.0)
MCV: 91.5 fL (ref 80.0–100.0)
Platelets: 145 10*3/uL — ABNORMAL LOW (ref 150–400)
RBC: 3.65 MIL/uL — ABNORMAL LOW (ref 3.87–5.11)
RDW: 12.7 % (ref 11.5–15.5)
WBC: 19.1 10*3/uL — ABNORMAL HIGH (ref 4.0–10.5)
nRBC: 0 % (ref 0.0–0.2)

## 2023-03-07 LAB — MAGNESIUM: Magnesium: 1.9 mg/dL (ref 1.7–2.4)

## 2023-03-07 LAB — TROPONIN I (HIGH SENSITIVITY): Troponin I (High Sensitivity): 4 ng/L (ref <18)

## 2023-03-07 LAB — LACTIC ACID, PLASMA: Lactic Acid, Venous: 1.3 mmol/L (ref 0.5–1.9)

## 2023-03-07 LAB — PHOSPHORUS: Phosphorus: 3.5 mg/dL (ref 2.5–4.6)

## 2023-03-07 LAB — D-DIMER, QUANTITATIVE: D-Dimer, Quant: 1.9 ug/mL-FEU — ABNORMAL HIGH (ref 0.00–0.50)

## 2023-03-07 LAB — LIPASE, BLOOD: Lipase: 20 U/L (ref 11–51)

## 2023-03-07 MED ORDER — ACETAMINOPHEN 650 MG RE SUPP: 650.0000 mg | Freq: Four times a day (QID) | RECTAL | Status: DC | PRN

## 2023-03-07 MED ORDER — HYDROCODONE-ACETAMINOPHEN 5-325 MG PO TABS
1.0000 | ORAL_TABLET | ORAL | Status: DC | PRN
  Administered 2023-03-07 – 2023-03-08 (×5): 1 via ORAL
  Filled 2023-03-07 (×5): qty 1

## 2023-03-07 MED ORDER — ACETAMINOPHEN 325 MG PO TABS
650.0000 mg | ORAL_TABLET | Freq: Once | ORAL | Status: AC
  Administered 2023-03-07: 650 mg via ORAL
  Filled 2023-03-07: qty 2

## 2023-03-07 MED ORDER — ACETAMINOPHEN 500 MG PO TABS
500.0000 mg | ORAL_TABLET | Freq: Four times a day (QID) | ORAL | Status: DC | PRN
  Administered 2023-03-08: 500 mg via ORAL
  Filled 2023-03-07: qty 1

## 2023-03-07 MED ORDER — ADULT MULTIVITAMIN W/MINERALS CH
1.0000 | ORAL_TABLET | Freq: Every day | ORAL | Status: DC
  Administered 2023-03-07 – 2023-03-09 (×3): 1 via ORAL
  Filled 2023-03-07 (×3): qty 1

## 2023-03-07 MED ORDER — FERROUS SULFATE 325 (65 FE) MG PO TABS
325.0000 mg | ORAL_TABLET | Freq: Every day | ORAL | Status: DC
  Administered 2023-03-07 – 2023-03-09 (×3): 325 mg via ORAL
  Filled 2023-03-07 (×3): qty 1

## 2023-03-07 MED ORDER — ACETAMINOPHEN 325 MG PO TABS: 650.0000 mg | ORAL_TABLET | Freq: Four times a day (QID) | ORAL | Status: DC | PRN

## 2023-03-07 MED ORDER — HYDROMORPHONE HCL 1 MG/ML IJ SOLN
1.0000 mg | Freq: Once | INTRAMUSCULAR | Status: AC
  Administered 2023-03-07: 1 mg via INTRAVENOUS
  Filled 2023-03-07: qty 1

## 2023-03-07 MED ORDER — ONDANSETRON HCL 4 MG PO TABS: 4.0000 mg | ORAL_TABLET | Freq: Four times a day (QID) | ORAL | Status: DC | PRN

## 2023-03-07 MED ORDER — VITAMIN D 25 MCG (1000 UNIT) PO TABS
1000.0000 [IU] | ORAL_TABLET | Freq: Every day | ORAL | Status: DC
  Administered 2023-03-07 – 2023-03-09 (×3): 1000 [IU] via ORAL
  Filled 2023-03-07 (×3): qty 1

## 2023-03-07 MED ORDER — SODIUM CHLORIDE 0.9 % IV SOLN
1.0000 g | INTRAVENOUS | Status: DC
  Administered 2023-03-07 – 2023-03-08 (×3): 1 g via INTRAVENOUS
  Filled 2023-03-07 (×3): qty 10

## 2023-03-07 MED ORDER — POTASSIUM CHLORIDE CRYS ER 20 MEQ PO TBCR
40.0000 meq | EXTENDED_RELEASE_TABLET | Freq: Once | ORAL | Status: AC
  Administered 2023-03-07: 40 meq via ORAL
  Filled 2023-03-07: qty 2

## 2023-03-07 MED ORDER — FENTANYL CITRATE PF 50 MCG/ML IJ SOSY
50.0000 ug | PREFILLED_SYRINGE | Freq: Once | INTRAMUSCULAR | Status: AC
  Administered 2023-03-07: 50 ug via INTRAVENOUS
  Filled 2023-03-07: qty 1

## 2023-03-07 MED ORDER — KETOROLAC TROMETHAMINE 30 MG/ML IJ SOLN: 30.0000 mg | Freq: Four times a day (QID) | INTRAMUSCULAR | Status: DC | PRN

## 2023-03-07 MED ORDER — FENTANYL CITRATE PF 50 MCG/ML IJ SOSY
100.0000 ug | PREFILLED_SYRINGE | Freq: Once | INTRAMUSCULAR | Status: AC
  Administered 2023-03-07: 100 ug via INTRAVENOUS
  Filled 2023-03-07: qty 2

## 2023-03-07 MED ORDER — SENNOSIDES-DOCUSATE SODIUM 8.6-50 MG PO TABS
1.0000 | ORAL_TABLET | Freq: Two times a day (BID) | ORAL | Status: DC
  Administered 2023-03-07 – 2023-03-09 (×5): 1 via ORAL
  Filled 2023-03-07 (×5): qty 1

## 2023-03-07 MED ORDER — SODIUM CHLORIDE 0.9 % IV BOLUS
1000.0000 mL | Freq: Once | INTRAVENOUS | Status: AC
  Administered 2023-03-07: 1000 mL via INTRAVENOUS

## 2023-03-07 MED ORDER — OXYCODONE HCL 5 MG PO TABS: 5.0000 mg | ORAL_TABLET | ORAL | Status: DC | PRN

## 2023-03-07 MED ORDER — ONDANSETRON HCL 4 MG/2ML IJ SOLN: 4.0000 mg | Freq: Four times a day (QID) | INTRAMUSCULAR | Status: DC | PRN

## 2023-03-07 MED ORDER — IOHEXOL 350 MG/ML SOLN
100.0000 mL | Freq: Once | INTRAVENOUS | Status: AC | PRN
  Administered 2023-03-07: 100 mL via INTRAVENOUS

## 2023-03-07 MED ORDER — METHOCARBAMOL 500 MG PO TABS
500.0000 mg | ORAL_TABLET | Freq: Three times a day (TID) | ORAL | Status: DC
  Administered 2023-03-07 – 2023-03-09 (×7): 500 mg via ORAL
  Filled 2023-03-07 (×7): qty 1

## 2023-03-07 MED ORDER — KETOROLAC TROMETHAMINE 15 MG/ML IJ SOLN
15.0000 mg | Freq: Once | INTRAMUSCULAR | Status: AC
  Administered 2023-03-07: 15 mg via INTRAVENOUS
  Filled 2023-03-07: qty 1

## 2023-03-07 MED ORDER — SODIUM CHLORIDE 0.9 % IV SOLN: INTRAVENOUS | Status: DC | PRN

## 2023-03-07 MED ORDER — POTASSIUM CHLORIDE IN NACL 20-0.9 MEQ/L-% IV SOLN
INTRAVENOUS | Status: AC
  Filled 2023-03-07 (×2): qty 1000

## 2023-03-07 MED ORDER — ENOXAPARIN SODIUM 40 MG/0.4ML IJ SOSY
40.0000 mg | PREFILLED_SYRINGE | INTRAMUSCULAR | Status: DC
  Administered 2023-03-07 – 2023-03-08 (×2): 40 mg via SUBCUTANEOUS
  Filled 2023-03-07 (×2): qty 0.4

## 2023-03-07 NOTE — TOC Progression Note (Signed)
Transition of Care [facility]) - Progression Note    Patient Details  Name: Elizabeth Rios MRN: [id number] Date of Birth: 12/02/1975  Transition of Care [facility]) CM/SW Contact  Geni Bers, RN Phone Number: 03/07/2023, 1:20 PM  Clinical Narrative:     Transition of Care (TOC) Screening Note   Patient Details  Name: Elizabeth Rios Date of Birth: May 04, 1976   [facility] of Care [facility]) CM/SW Contact:    Geni Bers, RN Phone Number: 03/07/2023, 1:20 PM    Transition of Care Department [facility]) has reviewed patient and no TOC needs have been identified at this time. We will continue to monitor patient advancement through interdisciplinary progression rounds. If new patient transition needs arise, please place a TOC consult.          Expected Discharge Plan and Services                                               Social Determinants of Health (SDOH) Interventions SDOH Screenings   Food Insecurity: No Food Insecurity (03/07/2023)  Housing: Low Risk  (03/07/2023)  Transportation Needs: No Transportation Needs (03/07/2023)  Utilities: Not At Risk (03/07/2023)  Tobacco Use: Low Risk  (03/06/2023)    Readmission Risk Interventions     No data to display

## 2023-03-07 NOTE — Progress Notes (Signed)
Hospitalist Transfer Note:  Transferring facility: [facility] Requesting provider: Dr. Bebe Shaggy (EDP at [facility]) Reason for transfer: admission for further evaluation and management of acute bilateral pyelonephritis.    46 year old female who presented to [facility] ED complaining of 2 to 3 days of generalized abdominal discomfort radiating into the chest, associated with generalized weakness in the absence of any acute focal weakness.   Vital signs in the ED were notable for the following: Temperature max 101; sinus tachycardia with heart rates in the 120s.  Labs were notable for urinalysis that showed significant pyuria.  Lactic acid nonelevated.  Imaging notable for CTA chest showed no evidence of acute pulmonary embolism, will CT abdomen/pelvis showed evidence of acute bilateral pyelonephritis in the absence of any evidence of postrenal obstruction and no evidence of ureteral stone.  Medications administered prior to transfer included the following: Rocephin   Subsequently, I accepted this patient for transfer for inpatient admission to a med/tele bed at [facility] or [facility] (first available)  for further work-up and management of the above.       Nursing staff, Please call [facility] Admits & Consults System-Wide number on Amion [phone]) as soon as patient's arrival, so appropriate admitting provider can evaluate the pt.     Newton Pigg, DO Hospitalist

## 2023-03-07 NOTE — H&P (Signed)
History and Physical    Patient: Elizabeth Rios ZOX:[id number] DOB: 1976/03/18 DOA: 03/06/2023 DOS: the patient was seen and examined on 03/07/2023 PCP: [facility]  Patient coming from: Home  Chief Complaint:  Chief Complaint  Patient presents with   Chest Pain   HPI: Elizabeth Rios is a 47 y.o. female with medical history significant of chlamydia, multiple solution IUD, class III obesity, status post gastric sleeve surgery, GERD, heart palpitations, hypertension who presented to the emergency department with complaints of malaise, pleuritic chest pain, dyspnea, dizziness, abdominal, flank pain and constipation.  She received a glucocorticoid injection in her lumbar spine on Tuesday.  Review of system is positive for bilateral flank pain, suprapubic discomfort, no dysuria, frequency or hematuria.She denied fever, chills, rhinorrhea, sore throat, wheezing or hemoptysis.  No palpitations, diaphoresis, PND, orthopnea or pitting edema of the lower extremities.  No abdominal pain, nausea, emesis, diarrhea, melena or hematochezia.    No polyuria, polydipsia, polyphagia or blurred vision.   Lab work: Her urinalysis was cloudy with moderate hemoglobin and leukocyte esterase.  Ketones 40 and protein 30 mg/dL.  Microscopic examination shows 11-20 RBCs, more than 50 WBCs and many bacteria.  Urine pregnancy test is negative.  CBC showed a white count 21.2, hemoglobin 13.1 g/dL platelets 811.  D-dimer 1.9.  Lactic acid, lipase and troponin were normal.  BMP showed a potassium of 3.30 mmol/L and a nonfasting glucose of 108 mg/dL.  The rest of the BMP measurements are normal after calcium correction.  LFTs showed a total bilirubin of 1.4 and direct bilirubin of 0.4 mg/dL.  The rest of the LFTs were normal.  Imaging: 2 view chest radiograph with no active cardiopulmonary disease.  Heart size and mediastinal contours are within normal limits.  CTA chest with no pulmonary embolism.  CT  abdomen/pelvis with contrast there was bilateral wedgelike regions of hypoenhancement suspicious for infectious or inflammatory process.  No hydronephrosis or perinephric fluid collections.  There is mild free fluid within the pelvis, likely physiologic in the patient of her age.  Status post gastric sleeve resection and cholecystectomy.  Please see images and full radiology report for further details.  ED course: Initial vital signs were temperature 98.3 F, pulse 96, respiration 20, BP 116/66 mmHg O2 sat 100% on room air.  The patient received acetaminophen 650 mg p.o., fentanyl 50 mcg IVP x 2, fentanyl 100 mcg IVP, Toradol 15 mg IVP, ondansetron 4 mg IVP and 2000 mL of normal saline bolus.  Review of Systems: As mentioned in the history of present illness. All other systems reviewed and are negative.  Past Medical History:  Diagnosis Date   Chlamydia 02/09/2021   Malpositioned IUD 07/05/2021   Past Surgical History:  Procedure Laterality Date   CESAREAN SECTION     CHOLECYSTECTOMY N/A 07/05/2021   Procedure: LAPAROSCOPIC CHOLECYSTECTOMY;  Surgeon: Gaynelle Adu, MD;  Location: [facility];  Service: General;  Laterality: N/A;   Social History:  reports that she has never smoked. She has never used smokeless tobacco. She reports that she does not currently use alcohol. She reports that she does not use drugs.  No Known Allergies  Family History  Problem Relation Age of Onset   Heart attack Mother 107       x 4   Diabetes type II Mother    Heart attack Father 68       x 1   Diabetes Mellitus II Father    Heart attack Brother 73  Diabetes Mellitus II Brother        Ended up with Pnacreatitis & BLE PAD amputations - complicatios of DM>     Prior to Admission medications   Medication Sig Start Date End Date Taking? Authorizing Provider  Vitamin D, Ergocalciferol, (DRISDOL) 1.25 MG (50000 UNIT) CAPS capsule Take 50,000 Units by mouth once a week. Friday 03/02/21   [provider]     Physical Exam: Vitals:   03/07/23 0239 03/07/23 0445 03/07/23 0452 03/07/23 0557  BP: (!) 143/89 123/71  125/81  Pulse: (!) 120 (!) 108  99  Resp: 19 18  (!) 22  Temp:   99.3 F (37.4 C) 98.5 F (36.9 C)  TempSrc:   Oral Oral  SpO2: 99% 97%  100%  Weight:    96.1 kg  Height:    5\' 2"  (1.575 m)   Physical Exam Vitals and nursing note reviewed.  Constitutional:      General: She is awake. She is not in acute distress.    Appearance: She is obese.  HENT:     Head: Normocephalic.     Nose: No rhinorrhea.     Mouth/Throat:     Mouth: Mucous membranes are moist.  Eyes:     General: No scleral icterus.    Pupils: Pupils are equal, round, and reactive to light.  Neck:     Vascular: No JVD.  Cardiovascular:     Rate and Rhythm: Normal rate and regular rhythm.     Heart sounds: S1 normal and S2 normal.  Pulmonary:     Effort: Pulmonary effort is normal.     Breath sounds: Normal breath sounds.  Abdominal:     General: Bowel sounds are normal.     Palpations: Abdomen is soft.     Tenderness: There is abdominal tenderness in the suprapubic area. There is right CVA tenderness and left CVA tenderness. There is no guarding or rebound.  Musculoskeletal:     Cervical back: Neck supple.     Right lower leg: No edema.     Left lower leg: No edema.  Skin:    General: Skin is warm and dry.  Neurological:     General: No focal deficit present.     Mental Status: She is alert and oriented to person, place, and time.  Psychiatric:        Mood and Affect: Mood normal.        Behavior: Behavior normal. Behavior is cooperative.    Data Reviewed:  There are no new results to review at this time.  Assessment and Plan: Principal Problem:   Acute pyelonephritis Telemetry/inpatient. Continue IV fluids. Analgesics as needed. Antiemetics as needed. Continue ceftriaxone 1 g IVPB daily. Follow-up urine culture and sensitivity. Follow CBC and chemistry in the morning.  Active  Problems:   H/O gastroesophageal reflux (GERD) Antiacid, H2 blocker or PPI as needed.    Hypertension Currently not on antihypertensives. Continue lifestyle modifications.    Hypokalemia Replacing. Check magnesium and phosphorus level. Follow-up potassium level in AM.    Class 2 obesity Current BMI 38.74 kg/m. Has decreased significantly post gastric sleeve. Continue lifestyle modifications and follow-up with PCP as an outpatient.     Advance Care Planning:   Code Status: Full Code   Consults:   Family Communication:   Severity of Illness: The appropriate patient status for this patient is INPATIENT. Inpatient status is judged to be reasonable and necessary in order to provide the required intensity of service  to ensure the patient's safety. The patient's presenting symptoms, physical exam findings, and initial radiographic and laboratory data in the context of their chronic comorbidities is felt to place them at high risk for further clinical deterioration. Furthermore, it is not anticipated that the patient will be medically stable for discharge from the hospital within 2 midnights of admission.   * I certify that at the point of admission it is my clinical judgment that the patient will require inpatient hospital care spanning beyond 2 midnights from the point of admission due to high intensity of service, high risk for further deterioration and high frequency of surveillance required.*  Author: Bobette Mo, MD 03/07/2023 7:46 AM  For on call review [email or web address].   This document was prepared using Dragon voice recognition software and may contain some unintended transcription errors.

## 2023-03-08 DIAGNOSIS — N12 Tubulo-interstitial nephritis, not specified as acute or chronic: Secondary | ICD-10-CM

## 2023-03-08 DIAGNOSIS — N10 Acute tubulo-interstitial nephritis: Secondary | ICD-10-CM | POA: Diagnosis not present

## 2023-03-08 LAB — COMPREHENSIVE METABOLIC PANEL
ALT: 58 U/L — ABNORMAL HIGH (ref 0–44)
AST: 54 U/L — ABNORMAL HIGH (ref 15–41)
Albumin: 2.9 g/dL — ABNORMAL LOW (ref 3.5–5.0)
Alkaline Phosphatase: 148 U/L — ABNORMAL HIGH (ref 38–126)
Anion gap: 7 (ref 5–15)
BUN: 6 mg/dL (ref 6–20)
CO2: 22 mmol/L (ref 22–32)
Calcium: 8.1 mg/dL — ABNORMAL LOW (ref 8.9–10.3)
Chloride: 109 mmol/L (ref 98–111)
Creatinine, Ser: 0.8 mg/dL (ref 0.44–1.00)
GFR, Estimated: >60 mL/min (ref >60)
Glucose, Bld: 109 mg/dL — ABNORMAL HIGH (ref 70–99)
Potassium: 3.9 mmol/L (ref 3.5–5.1)
Sodium: 138 mmol/L (ref 135–145)
Total Bilirubin: 1.3 mg/dL — ABNORMAL HIGH (ref 0.3–1.2)
Total Protein: 6.3 g/dL — ABNORMAL LOW (ref 6.5–8.1)

## 2023-03-08 LAB — CBC
HCT: 32 % — ABNORMAL LOW (ref 36.0–46.0)
Hemoglobin: 10.8 g/dL — ABNORMAL LOW (ref 12.0–15.0)
MCH: 30.8 pg (ref 26.0–34.0)
MCHC: 33.8 g/dL (ref 30.0–36.0)
MCV: 91.2 fL (ref 80.0–100.0)
Platelets: 139 10*3/uL — ABNORMAL LOW (ref 150–400)
RBC: 3.51 MIL/uL — ABNORMAL LOW (ref 3.87–5.11)
RDW: 12.8 % (ref 11.5–15.5)
WBC: 11.7 10*3/uL — ABNORMAL HIGH (ref 4.0–10.5)
nRBC: 0 % (ref 0.0–0.2)

## 2023-03-08 LAB — HEPATITIS PANEL, ACUTE
HCV Ab: NONREACTIVE
Hep A IgM: NONREACTIVE
Hep B C IgM: NONREACTIVE
Hepatitis B Surface Ag: NONREACTIVE

## 2023-03-08 LAB — HIV ANTIBODY (ROUTINE TESTING W REFLEX): HIV Screen 4th Generation wRfx: NONREACTIVE

## 2023-03-08 LAB — URINE CULTURE: Culture: >=100000 — AB

## 2023-03-08 MED ORDER — MORPHINE SULFATE (PF) 2 MG/ML IV SOLN
2.0000 mg | INTRAVENOUS | Status: DC | PRN
  Administered 2023-03-09: 2 mg via INTRAVENOUS
  Filled 2023-03-08: qty 1

## 2023-03-08 MED ORDER — PANTOPRAZOLE SODIUM 40 MG PO TBEC
40.0000 mg | DELAYED_RELEASE_TABLET | Freq: Every day | ORAL | Status: DC
  Administered 2023-03-08 – 2023-03-09 (×2): 40 mg via ORAL
  Filled 2023-03-08 (×2): qty 1

## 2023-03-08 MED ORDER — POLYETHYLENE GLYCOL 3350 17 G PO PACK
17.0000 g | PACK | Freq: Every day | ORAL | Status: DC
  Administered 2023-03-08 – 2023-03-09 (×2): 17 g via ORAL
  Filled 2023-03-08 (×2): qty 1

## 2023-03-08 MED ORDER — OXYCODONE HCL 5 MG PO TABS
5.0000 mg | ORAL_TABLET | ORAL | Status: DC | PRN
  Administered 2023-03-09: 5 mg via ORAL
  Filled 2023-03-08: qty 1

## 2023-03-08 MED ORDER — GABAPENTIN 300 MG PO CAPS: 300.0000 mg | ORAL_CAPSULE | Freq: Two times a day (BID) | ORAL | Status: DC | PRN

## 2023-03-08 MED ORDER — KETOROLAC TROMETHAMINE 15 MG/ML IJ SOLN
15.0000 mg | Freq: Four times a day (QID) | INTRAMUSCULAR | Status: DC
  Administered 2023-03-08 – 2023-03-09 (×4): 15 mg via INTRAVENOUS
  Filled 2023-03-08 (×4): qty 1

## 2023-03-08 NOTE — Progress Notes (Addendum)
PROGRESS NOTE    Elizabeth Rios  ZOX:[id number] DOB: [birth year] DOA: 03/06/2023 PCP: [facility]  Chief Complaint  Patient presents with   Chest Pain    Brief Narrative:   Elizabeth Rios is Elizabeth Rios 47 y.o. female with medical history significant of chlamydia, multiple solution IUD, class III obesity, status post gastric sleeve surgery, GERD, heart palpitations, hypertension who presented to the emergency department with complaints of malaise, pleuritic chest pain, dyspnea, dizziness, abdominal, flank pain and constipation.  She received Wynonna Fitzhenry glucocorticoid injection in her lumbar spine on Tuesday.  Review of system is positive for bilateral flank pain, suprapubic discomfort, no dysuria, frequency or hematuria.She denied fever, chills, rhinorrhea, sore throat, wheezing or hemoptysis.  No palpitations, diaphoresis, PND, orthopnea or pitting edema of the lower extremities.  No abdominal pain, nausea, emesis, diarrhea, melena or hematochezia.    No polyuria, polydipsia, polyphagia or blurred vision.    She was admitted for pyelonephritis.   Assessment & Plan:   Principal Problem:   Acute pyelonephritis Active Problems:   H/O gastroesophageal reflux (GERD)   Hypertension   Hypokalemia   Class 2 obesity  Acute pyelonephritis  Gram Negative Rod UTI CT Chest/abdomen/pelvis with heterogeneous cortical enhancement bilaterally with wedge like regiones of hypoenchancement bilaterally in keeping with an underlying infectious or inflammatory process (most commonly pyelo) - no acute intrathoracic pathology Urine cx with >100,000 gram negative rods Given fevers today, follow blood cultures  Continue ceftriaxone, adjust as needed Pain management with toradol, oxy, morphine for breakthrough  Elevated Liver Enzymes Mild, could be hemodynamically mediated with acute illness? Follow acute hepatitis panel She's s/p cholecystectomy   Hypertension Currently not on  antihypertensives. Continue lifestyle modifications.  Chronic Back Pain Related to fall in September Received steroid injection prior to admission Takes gabapentin prn at home She has both voltaren and ibuprofen as NSAIDs on her home med list, will need to discuss and discontinue one or both at discharge  H/O gastroesophageal reflux (GERD) PPI   Hypokalemia Replace and follow   Class 2 obesity  Hx Gastric Sleeve Current BMI 38.74 kg/m. Has decreased significantly post gastric sleeve. Continue multivitamin  Continue lifestyle modifications and follow-up with PCP as an outpatient.    DVT prophylaxis: lovenox Code Status: full Family Communication: none Disposition:   Status is: Inpatient Remains inpatient appropriate because: need for IV abx, continued monitoring   Consultants:  none  Procedures:  none  Antimicrobials:  Anti-infectives (From admission, onward)    Start     Dose/Rate Route Frequency Ordered Stop   03/07/23 0130  cefTRIAXone (ROCEPHIN) 1 g in sodium chloride 0.9 % 100 mL IVPB        1 g 200 mL/hr over 30 Minutes Intravenous Every 24 hours 03/07/23 0120         Subjective: C/o fever and back pain Overall better than when she presented  Objective: Vitals:   03/07/23 1402 03/07/23 2142 03/08/23 0435 03/08/23 0551  BP: 112/64 132/85 123/78   Pulse: (!) 106 97 100   Resp: 18 18 18    Temp: 99.5 F (37.5 C) 99.3 F (37.4 C) (!) 100.7 F (38.2 C) (!) 100.5 F (38.1 C)  TempSrc: Oral Oral Oral   SpO2: 100% 100% 100%   Weight:      Height:        Intake/Output Summary (Last 24 hours) at 03/08/2023 1027 Last data filed at 03/08/2023 0600 Gross per 24 hour  Intake 2932.27 ml  Output 650 ml  Net 2282.27 ml   Filed Weights   03/06/23 2024 03/07/23 0557  Weight: 92.1 kg 96.1 kg    Examination:  General exam: Appears calm and comfortable  Respiratory system: unlabored Cardiovascular system: RRR Gastrointestinal system: Abdomen is  nondistended, soft and nontender. Bilateral CVA tenderness to palpation  Central nervous system: Alert and oriented. No focal neurological deficits. Extremities: no LEE   Data Reviewed: I have personally reviewed following labs and imaging studies  CBC: Recent Labs  Lab 03/06/23 2327 03/07/23 0759 03/08/23 0426  WBC 21.2* 19.1* 11.7*  HGB 13.1 11.6* 10.8*  HCT 38.3 33.4* 32.0*  MCV 90.3 91.5 91.2  PLT 159 145* 139*    Basic Metabolic Panel: Recent Labs  Lab 03/06/23 2327 03/07/23 0759 03/08/23 0426  NA 136 136 138  K 3.3* 3.3* 3.9  CL 100 106 109  CO2 24 21* 22  GLUCOSE 108* 92 109*  BUN 12 10 6   CREATININE 0.92 0.81 0.80  CALCIUM 8.6* 7.8* 8.1*  MG  --  1.9  --   PHOS  --  3.5  --     GFR: Estimated Creatinine Clearance: 96 mL/min (by C-G formula based on SCr of 0.8 mg/dL).  Liver Function Tests: Recent Labs  Lab 03/06/23 2327 03/08/23 0426  AST 21 54*  ALT 24 58*  ALKPHOS 65 148*  BILITOT 1.4* 1.3*  PROT 7.4 6.3*  ALBUMIN 3.5 2.9*    CBG: No results for input(s): "GLUCAP" in the last 168 hours.   Recent Results (from the past 240 hour(s))  Urine Culture     Status: Abnormal (Preliminary result)   Collection Time: 03/07/23  1:31 AM   Specimen: Urine, Clean Catch  Result Value Ref Range Status   Specimen Description   Final    URINE, CLEAN CATCH Performed at [facility], [address] [place], [place] [address]    Special Requests   Final    NONE Performed at [facility], [address] [place], [place] [address]    Culture (Hai Grabe)  Final    >=100,000 COLONIES/mL ESCHERICHIA COLI SUSCEPTIBILITIES TO FOLLOW Performed at [facility], [address] [place], [place] [address]    Report Status PENDING  Incomplete         Radiology Studies: CT Angio Chest PE W and/or Wo Contrast  Result Date: 03/07/2023 CLINICAL DATA:  Pulmonary embolism (PE) suspected, high prob; Abdominal pain, acute, nonlocalized.  Chest pain, dyspnea, dizziness. EXAM: CT ANGIOGRAPHY CHEST CT ABDOMEN AND PELVIS WITH CONTRAST TECHNIQUE: Multidetector CT imaging of the chest was performed using the standard protocol during bolus administration of intravenous contrast. Multiplanar CT image reconstructions and MIPs were obtained to evaluate the vascular anatomy. Multidetector CT imaging of the abdomen and pelvis was performed using the standard protocol during bolus administration of intravenous contrast. RADIATION DOSE REDUCTION: This exam was performed according to the departmental dose-optimization program which includes automated exposure control, adjustment of the mA and/or kV according to patient size and/or use of iterative reconstruction technique. CONTRAST:  OMNIPAQUE IOHEXOL 350 MG/ML SOLN COMPARISON:  None Available. FINDINGS: CTA CHEST FINDINGS Cardiovascular: Adequate opacification of the pulmonary arterial tree. No intraluminal filling defect identified to suggest acute pulmonary embolism. Central pulmonary arteries are of normal caliber. No significant coronary artery calcification. Cardiac size within normal limits. No pericardial effusion. No significant atherosclerotic calcification within the thoracic aorta. No aortic aneurysm. Mediastinum/Nodes: No enlarged mediastinal, hilar, or axillary lymph nodes. Thyroid gland,  trachea, and esophagus demonstrate no significant findings. Lungs/Pleura: Lungs are clear. No pleural effusion or pneumothorax. Musculoskeletal: No acute bone abnormality. No lytic or blastic bone lesion. Review of the MIP images confirms the above findings. CT ABDOMEN and PELVIS FINDINGS Hepatobiliary: No focal liver abnormality is seen. Status post cholecystectomy. No biliary dilatation. Pancreas: Unremarkable Spleen: Unremarkable Adrenals/Urinary Tract: The adrenal glands are unremarkable. The kidneys are normal in size and position. There is heterogeneous cortical enhancement bilaterally with wedge like  regions of hypoenhancement bilaterally in keeping with an underlying infectious or inflammatory process, most commonly pyelonephritis. No hydronephrosis. Mild bilateral peripelvic and proximal periureteric inflammatory stranding. No intrarenal or ureteral calculi. No perinephric fluid collections. The bladder is unremarkable. Stomach/Bowel: Surgical changes of gastric sleeve resection are identified. The stomach, small bowel, large bowel are otherwise unremarkable. Appendix normal. No free intraperitoneal gas. Mild free fluid within the pelvis may be physiologic in Jeffre Enriques patient of this age. Vascular/Lymphatic: No significant vascular findings are present. No enlarged abdominal or pelvic lymph nodes. Reproductive: Intrauterine device is in place in expected position within the uterine cavity. The pelvic organs are otherwise unremarkable. Other: No abdominal wall hernia. Musculoskeletal: No acute or significant osseous findings. Review of the MIP images confirms the above findings. IMPRESSION: 1. No pulmonary embolism. No acute intrathoracic pathology identified. 2. Heterogeneous cortical enhancement bilaterally with wedge like regions of hypoenhancement bilaterally in keeping with an underlying infectious or inflammatory process, most commonly pyelonephritis. Correlation with urinalysis and urine culture would be helpful for confirmation. No hydronephrosis. No perinephric fluid collections. 3. Mild free fluid within the pelvis, likely physiologic in Ashrita Chrismer patient of this age. 4. Status post gastric sleeve resection and cholecystectomy. Electronically Signed   By: Helyn Numbers M.D.   On: 03/07/2023 01:17   CT ABDOMEN PELVIS W CONTRAST  Result Date: 03/07/2023 CLINICAL DATA:  Pulmonary embolism (PE) suspected, high prob; Abdominal pain, acute, nonlocalized. Chest pain, dyspnea, dizziness. EXAM: CT ANGIOGRAPHY CHEST CT ABDOMEN AND PELVIS WITH CONTRAST TECHNIQUE: Multidetector CT imaging of the chest was performed using  the standard protocol during bolus administration of intravenous contrast. Multiplanar CT image reconstructions and MIPs were obtained to evaluate the vascular anatomy. Multidetector CT imaging of the abdomen and pelvis was performed using the standard protocol during bolus administration of intravenous contrast. RADIATION DOSE REDUCTION: This exam was performed according to the departmental dose-optimization program which includes automated exposure control, adjustment of the mA and/or kV according to patient size and/or use of iterative reconstruction technique. CONTRAST:  OMNIPAQUE IOHEXOL 350 MG/ML SOLN COMPARISON:  None Available. FINDINGS: CTA CHEST FINDINGS Cardiovascular: Adequate opacification of the pulmonary arterial tree. No intraluminal filling defect identified to suggest acute pulmonary embolism. Central pulmonary arteries are of normal caliber. No significant coronary artery calcification. Cardiac size within normal limits. No pericardial effusion. No significant atherosclerotic calcification within the thoracic aorta. No aortic aneurysm. Mediastinum/Nodes: No enlarged mediastinal, hilar, or axillary lymph nodes. Thyroid gland, trachea, and esophagus demonstrate no significant findings. Lungs/Pleura: Lungs are clear. No pleural effusion or pneumothorax. Musculoskeletal: No acute bone abnormality. No lytic or blastic bone lesion. Review of the MIP images confirms the above findings. CT ABDOMEN and PELVIS FINDINGS Hepatobiliary: No focal liver abnormality is seen. Status post cholecystectomy. No biliary dilatation. Pancreas: Unremarkable Spleen: Unremarkable Adrenals/Urinary Tract: The adrenal glands are unremarkable. The kidneys are normal in size and position. There is heterogeneous cortical enhancement bilaterally with wedge like regions of hypoenhancement bilaterally in keeping with an underlying infectious or inflammatory process, most  commonly pyelonephritis. No hydronephrosis. Mild  bilateral peripelvic and proximal periureteric inflammatory stranding. No intrarenal or ureteral calculi. No perinephric fluid collections. The bladder is unremarkable. Stomach/Bowel: Surgical changes of gastric sleeve resection are identified. The stomach, small bowel, large bowel are otherwise unremarkable. Appendix normal. No free intraperitoneal gas. Mild free fluid within the pelvis may be physiologic in Emilie Carp patient of this age. Vascular/Lymphatic: No significant vascular findings are present. No enlarged abdominal or pelvic lymph nodes. Reproductive: Intrauterine device is in place in expected position within the uterine cavity. The pelvic organs are otherwise unremarkable. Other: No abdominal wall hernia. Musculoskeletal: No acute or significant osseous findings. Review of the MIP images confirms the above findings. IMPRESSION: 1. No pulmonary embolism. No acute intrathoracic pathology identified. 2. Heterogeneous cortical enhancement bilaterally with wedge like regions of hypoenhancement bilaterally in keeping with an underlying infectious or inflammatory process, most commonly pyelonephritis. Correlation with urinalysis and urine culture would be helpful for confirmation. No hydronephrosis. No perinephric fluid collections. 3. Mild free fluid within the pelvis, likely physiologic in Williemae Muriel patient of this age. 4. Status post gastric sleeve resection and cholecystectomy. Electronically Signed   By: Helyn Numbers M.D.   On: 03/07/2023 01:17   DG Chest 2 View  Result Date: 03/06/2023 CLINICAL DATA:  Chest pain EXAM: CHEST - 2 VIEW COMPARISON:  Chest x-ray 01/24/2018 FINDINGS: The heart size and mediastinal contours are within normal limits. Both lungs are clear. The visualized skeletal structures are unremarkable. IMPRESSION: No active cardiopulmonary disease. Electronically Signed   By: Darliss Cheney M.D.   On: 03/06/2023 20:59        Scheduled Meds:  cholecalciferol  1,000 Units Oral Daily   enoxaparin  (LOVENOX) injection  40 mg Subcutaneous Q24H   ferrous sulfate  325 mg Oral Daily   ketorolac  15 mg Intravenous Q6H   methocarbamol  500 mg Oral TID   multivitamin with minerals  1 tablet Oral Daily   senna-docusate  1 tablet Oral BID   Continuous Infusions:  sodium chloride Stopped (03/07/23 0336)   cefTRIAXone (ROCEPHIN)  IV 1 g (03/07/23 2156)     LOS: 1 day    Time spent: over 30 min    Lacretia Nicks, MD [facility]   To contact the attending provider between 7A-7P or the covering provider during after hours 7P-7A, please log into the web site [email or web address] and access using universal [place] password for that web site. If you do not have the password, please call the hospital operator.  03/08/2023, 10:27 AM

## 2023-03-09 DIAGNOSIS — N10 Acute tubulo-interstitial nephritis: Secondary | ICD-10-CM | POA: Diagnosis not present

## 2023-03-09 LAB — BASIC METABOLIC PANEL
Anion gap: 7 (ref 5–15)
BUN: 6 mg/dL (ref 6–20)
CO2: 24 mmol/L (ref 22–32)
Calcium: 8.7 mg/dL — ABNORMAL LOW (ref 8.9–10.3)
Chloride: 107 mmol/L (ref 98–111)
Creatinine, Ser: 0.8 mg/dL (ref 0.44–1.00)
GFR, Estimated: >60 mL/min (ref >60)
Glucose, Bld: 97 mg/dL (ref 70–99)
Potassium: 3.8 mmol/L (ref 3.5–5.1)
Sodium: 138 mmol/L (ref 135–145)

## 2023-03-09 LAB — CBC
HCT: 32 % — ABNORMAL LOW (ref 36.0–46.0)
Hemoglobin: 10.6 g/dL — ABNORMAL LOW (ref 12.0–15.0)
MCH: 30.8 pg (ref 26.0–34.0)
MCHC: 33.1 g/dL (ref 30.0–36.0)
MCV: 93 fL (ref 80.0–100.0)
Platelets: 167 10*3/uL (ref 150–400)
RBC: 3.44 MIL/uL — ABNORMAL LOW (ref 3.87–5.11)
RDW: 13.2 % (ref 11.5–15.5)
WBC: 8.3 10*3/uL (ref 4.0–10.5)
nRBC: 0 % (ref 0.0–0.2)

## 2023-03-09 LAB — CULTURE, BLOOD (ROUTINE X 2): Special Requests: ADEQUATE

## 2023-03-09 LAB — MAGNESIUM: Magnesium: 1.8 mg/dL (ref 1.7–2.4)

## 2023-03-09 LAB — URINE CULTURE

## 2023-03-09 LAB — PHOSPHORUS: Phosphorus: 2.8 mg/dL (ref 2.5–4.6)

## 2023-03-09 MED ORDER — CEFADROXIL 500 MG PO CAPS: 500.0000 mg | ORAL_CAPSULE | Freq: Two times a day (BID) | ORAL | Status: DC

## 2023-03-09 MED ORDER — SULFAMETHOXAZOLE-TRIMETHOPRIM 800-160 MG PO TABS: 1.0000 | ORAL_TABLET | Freq: Two times a day (BID) | ORAL | 0 refills | Status: AC

## 2023-03-09 MED ORDER — SULFAMETHOXAZOLE-TRIMETHOPRIM 800-160 MG PO TABS: 1.0000 | ORAL_TABLET | Freq: Two times a day (BID) | ORAL | Status: DC

## 2023-03-09 MED ORDER — POLYETHYLENE GLYCOL 3350 17 G PO PACK: 17.0000 g | PACK | Freq: Every day | ORAL | 0 refills | Status: AC | PRN

## 2023-03-09 NOTE — Progress Notes (Signed)
Report received from previous nurse, agreed with nurse assessment of patient and will cont to monitor. 

## 2023-03-09 NOTE — Discharge Summary (Addendum)
Physician Discharge Summary  Elizabeth Rios VWU:[id number] DOB: [birth year] DOA: 03/06/2023  PCP: [facility]  Admit date: 03/06/2023 Discharge date: 03/09/2023  Time spent: 40 minutes  Recommendations for Outpatient Follow-up:  Follow outpatient CBC/CMP  Follow culture results Follow LFT's outpatient   Discharge Diagnoses:  Principal Problem:   Acute pyelonephritis Active Problems:   H/O gastroesophageal reflux (GERD)   Hypertension   Hypokalemia   Class 2 obesity   Discharge Condition: stable  Diet recommendation: heart healthy   Filed Weights   03/06/23 2024 03/07/23 0557  Weight: 92.1 kg 96.1 kg    History of present illness:   Elizabeth Rios is Elizabeth Rios 47 y.o. female with medical history significant of chlamydia, multiple solution IUD, class III obesity, status post gastric sleeve surgery, GERD, heart palpitations, hypertension who presented to the emergency department with pyelonephritis.  She's improved with antibiotics.  See below for additional details  Hospital Course:  Assessment and Plan:  Acute pyelonephritis  E. Coli UTI CT Chest/abdomen/pelvis with heterogeneous cortical enhancement bilaterally with wedge like regiones of hypoenchancement bilaterally in keeping with an underlying infectious or inflammatory process (most commonly pyelo) - no acute intrathoracic pathology Urine cx with >100,000 e. Coli -> will discharge with bactrim  blood cultures (NG <24 hrs)   Elevated Liver Enzymes Mild, could be hemodynamically mediated with acute illness? Follow acute hepatitis panel - negative She's s/p cholecystectomy  Follow outpatient    Hypertension Currently not on antihypertensives. Continue lifestyle modifications.   Chronic Back Pain Related to fall in September Received steroid injection prior to admission Takes gabapentin prn at home She has both voltaren and ibuprofen as NSAIDs on her home med list, will need to discuss and  discontinue one or both at discharge   H/O gastroesophageal reflux (GERD) PPI   Hypokalemia Replace and follow   Class 2 obesity  Hx Gastric Sleeve Current BMI 38.74 kg/m. Has decreased significantly post gastric sleeve. Continue multivitamin  Continue lifestyle modifications and follow-up with PCP as an outpatient.        Procedures: none   Consultations: none  Discharge Exam: Vitals:   03/08/23 2104 03/09/23 0445  BP: (!) 127/93 137/73  Pulse: 81 71  Resp: 17   Temp: 99.8 F (37.7 C) 98.6 F (37 C)  SpO2: 98% 100%   Feels better Less CVA tenderness  General: No acute distress. Cardiovascular: RRR Lungs: unlabored Abdomen: Soft, nontender, nondistended CVA tenderness improved Neurological: Alert and oriented 3. Moves all extremities 4 with equal strength. Cranial nerves II through XII grossly intact. Extremities: No clubbing or cyanosis. No edema. Discharge Instructions   Discharge Instructions     Call MD for:  difficulty breathing, headache or visual disturbances   Complete by: As directed    Call MD for:  extreme fatigue   Complete by: As directed    Call MD for:  hives   Complete by: As directed    Call MD for:  persistant dizziness or light-headedness   Complete by: As directed    Call MD for:  persistant nausea and vomiting   Complete by: As directed    Call MD for:  redness, tenderness, or signs of infection (pain, swelling, redness, odor or green/yellow discharge around incision site)   Complete by: As directed    Call MD for:  severe uncontrolled pain   Complete by: As directed    Call MD for:  temperature >100.4   Complete by: As directed    Diet -  low sodium heart healthy   Complete by: As directed    Discharge instructions   Complete by: As directed    You were seen for Elizabeth Rios kidney infection (pyelonephritis).  You've improved with antibiotics.  We'll send you home on antibiotics to cover your infection.    Your liver enzymes were  mildly elevated.  Avoid alcohol.  Follow repeat labs with your PCP within Nela Bascom week or so.  If your labs continue to be abnormal, you may need additional workup with ultrasound or labs.  Voltaren and ibuprofen are both the same class of drug.  I removed these from your med list.  You should NOT take both.  If you need to start Graelyn Bihl NSAID (non steroidal anti inflammatory drug), don't take more than one (you can take ibuprofen OR voltaren, but you shouldn't take both).    Follow your final culture results with your PCP.   Return for new, recurrent, or worsening symptoms.  Please ask your PCP to request records from this hospitalization so they know what was done and what the next steps will be.   Increase activity slowly   Complete by: As directed       Allergies as of 03/09/2023   No Known Allergies      Medication List     STOP taking these medications    acetaminophen 500 MG tablet Commonly known as: TYLENOL   cyclobenzaprine 10 MG tablet Commonly known as: FLEXERIL   diclofenac 75 MG EC tablet Commonly known as: VOLTAREN   ibuprofen 200 MG tablet Commonly known as: ADVIL   oxyCODONE 5 MG immediate release tablet Commonly known as: Oxy IR/ROXICODONE       TAKE these medications    gabapentin 300 MG capsule Commonly known as: NEURONTIN Take 300 mg by mouth 2 (two) times daily as needed (Pain).   HYDROcodone-acetaminophen 5-325 MG tablet Commonly known as: NORCO/VICODIN Take 1 tablet by mouth 2 (two) times daily as needed for moderate pain.   IRON PO Take 1 tablet by mouth daily.   methocarbamol 500 MG tablet Commonly known as: ROBAXIN Take 500 mg by mouth 3 (three) times daily as needed for muscle spasms.   MULTIVITAMIN PO Take 1 tablet by mouth daily.   nystatin powder Commonly known as: MYCOSTATIN/NYSTOP Apply 1 Application topically daily as needed (irritation).   omeprazole 40 MG capsule Commonly known as: PRILOSEC Take 40 mg by mouth daily.    polyethylene glycol 17 g packet Commonly known as: MIRALAX / GLYCOLAX Take 17 g by mouth daily as needed (constipation).   sulfamethoxazole-trimethoprim 800-160 MG tablet Commonly known as: BACTRIM DS Take 1 tablet by mouth every 12 (twelve) hours for 7 days.   Vitamin D (Ergocalciferol) 1.25 MG (50000 UNIT) Caps capsule Commonly known as: DRISDOL Take 50,000 Units by mouth once Catharina Pica week. Friday   VITAMIN D PO Take 1 tablet by mouth daily.       No Known Allergies    The results of significant diagnostics from this hospitalization (including imaging, microbiology, ancillary and laboratory) are listed below for reference.    Significant Diagnostic Studies: CT Angio Chest PE W and/or Wo Contrast  Result Date: 03/07/2023 CLINICAL DATA:  Pulmonary embolism (PE) suspected, high prob; Abdominal pain, acute, nonlocalized. Chest pain, dyspnea, dizziness. EXAM: CT ANGIOGRAPHY CHEST CT ABDOMEN AND PELVIS WITH CONTRAST TECHNIQUE: Multidetector CT imaging of the chest was performed using the standard protocol during bolus administration of intravenous contrast. Multiplanar CT image reconstructions and MIPs were obtained  to evaluate the vascular anatomy. Multidetector CT imaging of the abdomen and pelvis was performed using the standard protocol during bolus administration of intravenous contrast. RADIATION DOSE REDUCTION: This exam was performed according to the departmental dose-optimization program which includes automated exposure control, adjustment of the mA and/or kV according to patient size and/or use of iterative reconstruction technique. CONTRAST:  OMNIPAQUE IOHEXOL 350 MG/ML SOLN COMPARISON:  None Available. FINDINGS: CTA CHEST FINDINGS Cardiovascular: Adequate opacification of the pulmonary arterial tree. No intraluminal filling defect identified to suggest acute pulmonary embolism. Central pulmonary arteries are of normal caliber. No significant coronary artery calcification.  Cardiac size within normal limits. No pericardial effusion. No significant atherosclerotic calcification within the thoracic aorta. No aortic aneurysm. Mediastinum/Nodes: No enlarged mediastinal, hilar, or axillary lymph nodes. Thyroid gland, trachea, and esophagus demonstrate no significant findings. Lungs/Pleura: Lungs are clear. No pleural effusion or pneumothorax. Musculoskeletal: No acute bone abnormality. No lytic or blastic bone lesion. Review of the MIP images confirms the above findings. CT ABDOMEN and PELVIS FINDINGS Hepatobiliary: No focal liver abnormality is seen. Status post cholecystectomy. No biliary dilatation. Pancreas: Unremarkable Spleen: Unremarkable Adrenals/Urinary Tract: The adrenal glands are unremarkable. The kidneys are normal in size and position. There is heterogeneous cortical enhancement bilaterally with wedge like regions of hypoenhancement bilaterally in keeping with an underlying infectious or inflammatory process, most commonly pyelonephritis. No hydronephrosis. Mild bilateral peripelvic and proximal periureteric inflammatory stranding. No intrarenal or ureteral calculi. No perinephric fluid collections. The bladder is unremarkable. Stomach/Bowel: Surgical changes of gastric sleeve resection are identified. The stomach, small bowel, large bowel are otherwise unremarkable. Appendix normal. No free intraperitoneal gas. Mild free fluid within the pelvis may be physiologic in Raymon Schlarb patient of this age. Vascular/Lymphatic: No significant vascular findings are present. No enlarged abdominal or pelvic lymph nodes. Reproductive: Intrauterine device is in place in expected position within the uterine cavity. The pelvic organs are otherwise unremarkable. Other: No abdominal wall hernia. Musculoskeletal: No acute or significant osseous findings. Review of the MIP images confirms the above findings. IMPRESSION: 1. No pulmonary embolism. No acute intrathoracic pathology identified. 2. Heterogeneous  cortical enhancement bilaterally with wedge like regions of hypoenhancement bilaterally in keeping with an underlying infectious or inflammatory process, most commonly pyelonephritis. Correlation with urinalysis and urine culture would be helpful for confirmation. No hydronephrosis. No perinephric fluid collections. 3. Mild free fluid within the pelvis, likely physiologic in Ameyah Bangura patient of this age. 4. Status post gastric sleeve resection and cholecystectomy. Electronically Signed   By: Helyn Numbers M.D.   On: 03/07/2023 01:17   CT ABDOMEN PELVIS W CONTRAST  Result Date: 03/07/2023 CLINICAL DATA:  Pulmonary embolism (PE) suspected, high prob; Abdominal pain, acute, nonlocalized. Chest pain, dyspnea, dizziness. EXAM: CT ANGIOGRAPHY CHEST CT ABDOMEN AND PELVIS WITH CONTRAST TECHNIQUE: Multidetector CT imaging of the chest was performed using the standard protocol during bolus administration of intravenous contrast. Multiplanar CT image reconstructions and MIPs were obtained to evaluate the vascular anatomy. Multidetector CT imaging of the abdomen and pelvis was performed using the standard protocol during bolus administration of intravenous contrast. RADIATION DOSE REDUCTION: This exam was performed according to the departmental dose-optimization program which includes automated exposure control, adjustment of the mA and/or kV according to patient size and/or use of iterative reconstruction technique. CONTRAST:  OMNIPAQUE IOHEXOL 350 MG/ML SOLN COMPARISON:  None Available. FINDINGS: CTA CHEST FINDINGS Cardiovascular: Adequate opacification of the pulmonary arterial tree. No intraluminal filling defect identified to suggest acute pulmonary embolism. Central pulmonary arteries  are of normal caliber. No significant coronary artery calcification. Cardiac size within normal limits. No pericardial effusion. No significant atherosclerotic calcification within the thoracic aorta. No aortic aneurysm.  Mediastinum/Nodes: No enlarged mediastinal, hilar, or axillary lymph nodes. Thyroid gland, trachea, and esophagus demonstrate no significant findings. Lungs/Pleura: Lungs are clear. No pleural effusion or pneumothorax. Musculoskeletal: No acute bone abnormality. No lytic or blastic bone lesion. Review of the MIP images confirms the above findings. CT ABDOMEN and PELVIS FINDINGS Hepatobiliary: No focal liver abnormality is seen. Status post cholecystectomy. No biliary dilatation. Pancreas: Unremarkable Spleen: Unremarkable Adrenals/Urinary Tract: The adrenal glands are unremarkable. The kidneys are normal in size and position. There is heterogeneous cortical enhancement bilaterally with wedge like regions of hypoenhancement bilaterally in keeping with an underlying infectious or inflammatory process, most commonly pyelonephritis. No hydronephrosis. Mild bilateral peripelvic and proximal periureteric inflammatory stranding. No intrarenal or ureteral calculi. No perinephric fluid collections. The bladder is unremarkable. Stomach/Bowel: Surgical changes of gastric sleeve resection are identified. The stomach, small bowel, large bowel are otherwise unremarkable. Appendix normal. No free intraperitoneal gas. Mild free fluid within the pelvis may be physiologic in Ethylene Reznick patient of this age. Vascular/Lymphatic: No significant vascular findings are present. No enlarged abdominal or pelvic lymph nodes. Reproductive: Intrauterine device is in place in expected position within the uterine cavity. The pelvic organs are otherwise unremarkable. Other: No abdominal wall hernia. Musculoskeletal: No acute or significant osseous findings. Review of the MIP images confirms the above findings. IMPRESSION: 1. No pulmonary embolism. No acute intrathoracic pathology identified. 2. Heterogeneous cortical enhancement bilaterally with wedge like regions of hypoenhancement bilaterally in keeping with an underlying infectious or inflammatory  process, most commonly pyelonephritis. Correlation with urinalysis and urine culture would be helpful for confirmation. No hydronephrosis. No perinephric fluid collections. 3. Mild free fluid within the pelvis, likely physiologic in Mischa Pollard patient of this age. 4. Status post gastric sleeve resection and cholecystectomy. Electronically Signed   By: Helyn Numbers M.D.   On: 03/07/2023 01:17   DG Chest 2 View  Result Date: 03/06/2023 CLINICAL DATA:  Chest pain EXAM: CHEST - 2 VIEW COMPARISON:  Chest x-ray 01/24/2018 FINDINGS: The heart size and mediastinal contours are within normal limits. Both lungs are clear. The visualized skeletal structures are unremarkable. IMPRESSION: No active cardiopulmonary disease. Electronically Signed   By: Darliss Cheney M.D.   On: 03/06/2023 20:59    Microbiology: Recent Results (from the past 240 hour(s))  Urine Culture     Status: Abnormal   Collection Time: 03/07/23  1:31 AM   Specimen: Urine, Clean Catch  Result Value Ref Range Status   Specimen Description   Final    URINE, CLEAN CATCH Performed at [facility], [address] [place], [place] [address]    Special Requests   Final    NONE Performed at [facility], [address] [place], [place] [address]    Culture >=100,000 COLONIES/mL ESCHERICHIA COLI (Kahla Risdon)  Final   Report Status 03/09/2023 FINAL  Final   Organism ID, Bacteria ESCHERICHIA COLI (Raynald Rouillard)  Final      Susceptibility   Escherichia coli - MIC*    AMPICILLIN <=2 SENSITIVE Sensitive     CEFAZOLIN <=4 SENSITIVE Sensitive     CEFEPIME <=0.12 SENSITIVE Sensitive     CEFTRIAXONE <=0.25 SENSITIVE Sensitive     CIPROFLOXACIN <=0.25 SENSITIVE Sensitive     GENTAMICIN <=1 SENSITIVE Sensitive     IMIPENEM <=0.25 SENSITIVE Sensitive  NITROFURANTOIN <=16 SENSITIVE Sensitive     TRIMETH/SULFA <=20 SENSITIVE Sensitive     AMPICILLIN/SULBACTAM <=2 SENSITIVE Sensitive     PIP/TAZO <=4 SENSITIVE Sensitive     * >=100,000 COLONIES/mL  ESCHERICHIA COLI  Culture, blood (Routine X 2) w Reflex to ID Panel     Status: None (Preliminary result)   Collection Time: 03/08/23 10:40 AM   Specimen: BLOOD  Result Value Ref Range Status   Specimen Description   Final    BLOOD BLOOD RIGHT HAND Performed at [facility], [address] [place], [place] [address]    Special Requests   Final    BOTTLES DRAWN AEROBIC ONLY Blood Culture adequate volume Performed at [facility], [address], [place], [place] [address]    Culture   Final    NO GROWTH < 24 HOURS Performed at [facility], [address], [place], [place] [address]    Report Status PENDING  Incomplete  Culture, blood (Routine X 2) w Reflex to ID Panel     Status: None (Preliminary result)   Collection Time: 03/08/23 10:47 AM   Specimen: BLOOD  Result Value Ref Range Status   Specimen Description   Final    BLOOD BLOOD LEFT ARM Performed at [facility], [address] [place], [place] [address]    Special Requests   Final    BOTTLES DRAWN AEROBIC ONLY Blood Culture adequate volume Performed at [facility], [address], [place], [place] [address]    Culture   Final    NO GROWTH < 24 HOURS Performed at [facility], [address], [place], [place] [address]    Report Status PENDING  Incomplete     Labs: Basic Metabolic Panel: Recent Labs  Lab 03/06/23 2327 03/07/23 0759 03/08/23 0426 03/09/23 0821  NA 136 136 138 138  K 3.3* 3.3* 3.9 3.8  CL 100 106 109 107  CO2 24 21* 22 24  GLUCOSE 108* 92 109* 97  BUN 12 10 6 6   CREATININE 0.92 0.81 0.80 0.80  CALCIUM 8.6* 7.8* 8.1* 8.7*  MG  --  1.9  --  1.8  PHOS  --  3.5  --  2.8   Liver Function Tests: Recent Labs  Lab 03/06/23 2327 03/08/23 0426  AST 21 54*  ALT 24 58*  ALKPHOS 65 148*  BILITOT 1.4* 1.3*  PROT 7.4 6.3*  ALBUMIN 3.5 2.9*   Recent Labs  Lab 03/06/23 2327  LIPASE 20   No results  for input(s): "AMMONIA" in the last 168 hours. CBC: Recent Labs  Lab 03/06/23 2327 03/07/23 0759 03/08/23 0426 03/09/23 0821  WBC 21.2* 19.1* 11.7* 8.3  HGB 13.1 11.6* 10.8* 10.6*  HCT 38.3 33.4* 32.0* 32.0*  MCV 90.3 91.5 91.2 93.0  PLT 159 145* 139* 167   Cardiac Enzymes: No results for input(s): "CKTOTAL", "CKMB", "CKMBINDEX", "TROPONINI" in the last 168 hours. BNP: BNP (last 3 results) No results for input(s): "BNP" in the last 8760 hours.  ProBNP (last 3 results) No results for input(s): "PROBNP" in the last 8760 hours.  CBG: No results for input(s): "GLUCAP" in the last 168 hours.     Signed:  Lacretia Nicks MD.  [facility] 03/09/2023, 11:41 AM

## 2023-03-10 LAB — CULTURE, BLOOD (ROUTINE X 2)

## 2023-03-11 LAB — CULTURE, BLOOD (ROUTINE X 2): Culture: NO GROWTH

## 2023-03-12 LAB — CULTURE, BLOOD (ROUTINE X 2): Special Requests: ADEQUATE

## 2023-03-13 LAB — CULTURE, BLOOD (ROUTINE X 2): Culture: NO GROWTH

## 2024-05-23 ENCOUNTER — Emergency Department (HOSPITAL_BASED_OUTPATIENT_CLINIC_OR_DEPARTMENT_OTHER)
Admission: EM | Admit: 2024-05-23 | Discharge: 2024-05-23 | Disposition: A | Discharge status: Home / Self Care | Attending: Emergency Medicine | Admitting: Emergency Medicine

## 2024-05-23 ENCOUNTER — Other Ambulatory Visit: Payer: Self-pay

## 2024-05-23 DIAGNOSIS — Y92410 Unspecified street and highway as the place of occurrence of the external cause: Secondary | ICD-10-CM | POA: Diagnosis not present

## 2024-05-23 DIAGNOSIS — M791 Myalgia, unspecified site: Secondary | ICD-10-CM | POA: Insufficient documentation

## 2024-05-23 MED ORDER — METHOCARBAMOL 500 MG PO TABS: 500.0000 mg | ORAL_TABLET | Freq: Three times a day (TID) | ORAL | 0 refills | Status: AC | PRN
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
